# Patient Record
Sex: Male | Born: 1955 | Race: White | Hispanic: No | Smoking: Current every day smoker
Health system: Southern US, Community
[De-identification: ages and names within clinical notes are randomized; demographics above are authoritative.]

## PROBLEM LIST (undated history)

## (undated) DIAGNOSIS — T18128A Food in esophagus causing other injury, initial encounter: Secondary | ICD-10-CM

## (undated) DIAGNOSIS — K219 Gastro-esophageal reflux disease without esophagitis: Secondary | ICD-10-CM

## (undated) DIAGNOSIS — I4891 Unspecified atrial fibrillation: Secondary | ICD-10-CM

## (undated) DIAGNOSIS — K222 Esophageal obstruction: Secondary | ICD-10-CM

## (undated) DIAGNOSIS — W44F3XA Food entering into or through a natural orifice, initial encounter: Secondary | ICD-10-CM

## (undated) HISTORY — DX: Esophageal obstruction: K22.2

## (undated) HISTORY — DX: Food in esophagus causing other injury, initial encounter: T18.128A

## (undated) HISTORY — DX: Food entering into or through a natural orifice, initial encounter: W44.F3XA

## (undated) HISTORY — PX: APPENDECTOMY: SHX54

## (undated) HISTORY — DX: Unspecified atrial fibrillation: I48.91

---

## 2001-08-29 ENCOUNTER — Encounter: Payer: Self-pay | Admitting: Emergency Medicine

## 2001-08-30 ENCOUNTER — Inpatient Hospital Stay (HOSPITAL_COMMUNITY): Admission: EM | Admit: 2001-08-30 | Discharge: 2001-09-03 | Payer: Self-pay | Admitting: Emergency Medicine

## 2003-09-06 ENCOUNTER — Emergency Department (HOSPITAL_COMMUNITY): Admission: EM | Admit: 2003-09-06 | Discharge: 2003-09-06 | Payer: Self-pay | Admitting: Emergency Medicine

## 2011-11-13 ENCOUNTER — Emergency Department (HOSPITAL_COMMUNITY)
Admission: EM | Admit: 2011-11-13 | Discharge: 2011-11-13 | Disposition: A | Discharge status: Home / Self Care | Payer: Non-veteran care | Source: Home / Self Care | Attending: Emergency Medicine | Admitting: Emergency Medicine

## 2011-11-13 ENCOUNTER — Encounter (HOSPITAL_BASED_OUTPATIENT_CLINIC_OR_DEPARTMENT_OTHER): Payer: Self-pay | Admitting: Anesthesiology

## 2011-11-13 ENCOUNTER — Encounter (HOSPITAL_COMMUNITY): Payer: Self-pay | Admitting: Emergency Medicine

## 2011-11-13 ENCOUNTER — Emergency Department (HOSPITAL_COMMUNITY): Payer: Non-veteran care

## 2011-11-13 ENCOUNTER — Ambulatory Visit (HOSPITAL_BASED_OUTPATIENT_CLINIC_OR_DEPARTMENT_OTHER): Payer: Non-veteran care | Admitting: Anesthesiology

## 2011-11-13 ENCOUNTER — Ambulatory Visit (HOSPITAL_BASED_OUTPATIENT_CLINIC_OR_DEPARTMENT_OTHER)
Admission: RE | Admit: 2011-11-13 | Discharge: 2011-11-13 | Disposition: A | Discharge status: Home / Self Care | Payer: Non-veteran care | Source: Ambulatory Visit | Attending: Orthopedic Surgery | Admitting: Orthopedic Surgery

## 2011-11-13 ENCOUNTER — Encounter (HOSPITAL_BASED_OUTPATIENT_CLINIC_OR_DEPARTMENT_OTHER): Payer: Self-pay | Admitting: *Deleted

## 2011-11-13 ENCOUNTER — Encounter (HOSPITAL_BASED_OUTPATIENT_CLINIC_OR_DEPARTMENT_OTHER)
Admission: RE | Disposition: A | Discharge status: Home / Self Care | Payer: Self-pay | Source: Ambulatory Visit | Attending: Orthopedic Surgery

## 2011-11-13 DIAGNOSIS — M79609 Pain in unspecified limb: Secondary | ICD-10-CM | POA: Insufficient documentation

## 2011-11-13 DIAGNOSIS — W64XXXA Exposure to other animate mechanical forces, initial encounter: Secondary | ICD-10-CM | POA: Insufficient documentation

## 2011-11-13 DIAGNOSIS — S62609B Fracture of unspecified phalanx of unspecified finger, initial encounter for open fracture: Secondary | ICD-10-CM | POA: Insufficient documentation

## 2011-11-13 DIAGNOSIS — IMO0002 Reserved for concepts with insufficient information to code with codable children: Secondary | ICD-10-CM | POA: Insufficient documentation

## 2011-11-13 DIAGNOSIS — F17200 Nicotine dependence, unspecified, uncomplicated: Secondary | ICD-10-CM | POA: Insufficient documentation

## 2011-11-13 HISTORY — PX: TENDON REPAIR: SHX5111

## 2011-11-13 SURGERY — TENDON REPAIR
Anesthesia: General | Site: Finger | Laterality: Left | Wound class: Dirty or Infected

## 2011-11-13 MED ORDER — ONDANSETRON HCL 4 MG/2ML IJ SOLN
INTRAMUSCULAR | Status: DC | PRN
  Administered 2011-11-13: 4 mg via INTRAVENOUS

## 2011-11-13 MED ORDER — DOXYCYCLINE HYCLATE 100 MG PO TABS: 100.0000 mg | ORAL_TABLET | Freq: Two times a day (BID) | ORAL | Status: DC

## 2011-11-13 MED ORDER — DEXAMETHASONE SODIUM PHOSPHATE 4 MG/ML IJ SOLN
INTRAMUSCULAR | Status: DC | PRN
  Administered 2011-11-13: 10 mg via INTRAVENOUS

## 2011-11-13 MED ORDER — FENTANYL CITRATE 0.05 MG/ML IJ SOLN
INTRAMUSCULAR | Status: DC | PRN
  Administered 2011-11-13: 100 ug via INTRAVENOUS

## 2011-11-13 MED ORDER — LIDOCAINE HCL (CARDIAC) 20 MG/ML IV SOLN
INTRAVENOUS | Status: DC | PRN
  Administered 2011-11-13: 75 mg via INTRAVENOUS

## 2011-11-13 MED ORDER — MIDAZOLAM HCL 5 MG/5ML IJ SOLN
INTRAMUSCULAR | Status: DC | PRN
  Administered 2011-11-13: 2 mg via INTRAVENOUS

## 2011-11-13 MED ORDER — OXYCODONE-ACETAMINOPHEN 5-325 MG PO TABS: ORAL_TABLET | ORAL | Status: DC

## 2011-11-13 MED ORDER — CEFAZOLIN SODIUM 1-5 GM-% IV SOLN
1.0000 g | Freq: Once | INTRAVENOUS | Status: AC
  Administered 2011-11-13: 1 g via INTRAVENOUS
  Administered 2011-11-13: 2 g via INTRAVENOUS
  Filled 2011-11-13: qty 50

## 2011-11-13 MED ORDER — PROPOFOL 10 MG/ML IV BOLUS
INTRAVENOUS | Status: DC | PRN
  Administered 2011-11-13: 200 mg via INTRAVENOUS

## 2011-11-13 MED ORDER — ONDANSETRON HCL 4 MG/2ML IJ SOLN
4.0000 mg | Freq: Once | INTRAMUSCULAR | Status: AC
  Administered 2011-11-13: 4 mg via INTRAVENOUS
  Filled 2011-11-13: qty 2

## 2011-11-13 MED ORDER — BUPIVACAINE HCL (PF) 0.25 % IJ SOLN
INTRAMUSCULAR | Status: DC | PRN
  Administered 2011-11-13: 10 mL

## 2011-11-13 MED ORDER — BUPIVACAINE HCL (PF) 0.5 % IJ SOLN
10.0000 mL | Freq: Once | INTRAMUSCULAR | Status: AC
  Administered 2011-11-13: 10 mL
  Filled 2011-11-13: qty 30

## 2011-11-13 MED ORDER — MORPHINE SULFATE 4 MG/ML IJ SOLN
4.0000 mg | Freq: Once | INTRAMUSCULAR | Status: AC
  Administered 2011-11-13: 4 mg via INTRAVENOUS
  Filled 2011-11-13: qty 1

## 2011-11-13 MED ORDER — TETANUS-DIPHTH-ACELL PERTUSSIS 5-2.5-18.5 LF-MCG/0.5 IM SUSP
0.5000 mL | Freq: Once | INTRAMUSCULAR | Status: AC
  Administered 2011-11-13: 0.5 mL via INTRAMUSCULAR
  Filled 2011-11-13: qty 0.5

## 2011-11-13 MED ORDER — LACTATED RINGERS IV SOLN
INTRAVENOUS | Status: DC
  Administered 2011-11-13 (×2): via INTRAVENOUS

## 2011-11-13 SURGICAL SUPPLY — 83 items
BAG DECANTER FOR FLEXI CONT (MISCELLANEOUS) IMPLANT
BANDAGE CONFORM 2  STR LF (GAUZE/BANDAGES/DRESSINGS) IMPLANT
BANDAGE ELASTIC 3 VELCRO ST LF (GAUZE/BANDAGES/DRESSINGS) ×2 IMPLANT
BANDAGE GAUZE ELAST BULKY 4 IN (GAUZE/BANDAGES/DRESSINGS) ×2 IMPLANT
BLADE MINI RND TIP GREEN BEAV (BLADE) IMPLANT
BLADE SURG 15 STRL LF DISP TIS (BLADE) ×2 IMPLANT
BLADE SURG 15 STRL SS (BLADE) ×2
BNDG ELASTIC 2 VLCR STRL LF (GAUZE/BANDAGES/DRESSINGS) IMPLANT
BNDG ESMARK 4X9 LF (GAUZE/BANDAGES/DRESSINGS) ×2 IMPLANT
CHLORAPREP W/TINT 26ML (MISCELLANEOUS) IMPLANT
CLOTH BEACON ORANGE TIMEOUT ST (SAFETY) ×2 IMPLANT
CORDS BIPOLAR (ELECTRODE) ×2 IMPLANT
COTTONBALL LRG STERILE PKG (GAUZE/BANDAGES/DRESSINGS) IMPLANT
COVER MAYO STAND STRL (DRAPES) ×2 IMPLANT
COVER TABLE BACK 60X90 (DRAPES) ×2 IMPLANT
CUFF TOURNIQUET SINGLE 18IN (TOURNIQUET CUFF) ×2 IMPLANT
DECANTER SPIKE VIAL GLASS SM (MISCELLANEOUS) IMPLANT
DRAIN TLS ROUND 10FR (DRAIN) IMPLANT
DRAPE EXTREMITY T 121X128X90 (DRAPE) ×2 IMPLANT
DRAPE OEC MINIVIEW 54X84 (DRAPES) ×2 IMPLANT
DRAPE SURG 17X23 STRL (DRAPES) ×2 IMPLANT
DRSG PAD ABDOMINAL 8X10 ST (GAUZE/BANDAGES/DRESSINGS) IMPLANT
GAUZE SPONGE 4X4 16PLY XRAY LF (GAUZE/BANDAGES/DRESSINGS) IMPLANT
GAUZE XEROFORM 1X8 LF (GAUZE/BANDAGES/DRESSINGS) ×2 IMPLANT
GLOVE BIO SURGEON STRL SZ 6.5 (GLOVE) ×4 IMPLANT
GLOVE BIO SURGEON STRL SZ7.5 (GLOVE) ×2 IMPLANT
GLOVE BIOGEL PI IND STRL 8 (GLOVE) ×1 IMPLANT
GLOVE BIOGEL PI INDICATOR 8 (GLOVE) ×1
GLOVE SURG ORTHO 8.0 STRL STRW (GLOVE) IMPLANT
GOWN PREVENTION PLUS XLARGE (GOWN DISPOSABLE) ×2 IMPLANT
GOWN PREVENTION PLUS XXLARGE (GOWN DISPOSABLE) IMPLANT
GOWN STRL REIN XL XLG (GOWN DISPOSABLE) ×2 IMPLANT
KWIRE 4.0 X .035IN (WIRE) ×4 IMPLANT
LOOP VESSEL MAXI BLUE (MISCELLANEOUS) IMPLANT
NEEDLE HYPO 22GX1.5 SAFETY (NEEDLE) IMPLANT
NEEDLE HYPO 25X1 1.5 SAFETY (NEEDLE) ×2 IMPLANT
NEEDLE KEITH (NEEDLE) IMPLANT
NS IRRIG 1000ML POUR BTL (IV SOLUTION) ×2 IMPLANT
PACK BASIN DAY SURGERY FS (CUSTOM PROCEDURE TRAY) ×2 IMPLANT
PAD CAST 3X4 CTTN HI CHSV (CAST SUPPLIES) IMPLANT
PAD CAST 4YDX4 CTTN HI CHSV (CAST SUPPLIES) IMPLANT
PADDING CAST ABS 3INX4YD NS (CAST SUPPLIES) ×1
PADDING CAST ABS 4INX4YD NS (CAST SUPPLIES)
PADDING CAST ABS COTTON 3X4 (CAST SUPPLIES) ×1 IMPLANT
PADDING CAST ABS COTTON 4X4 ST (CAST SUPPLIES) IMPLANT
PADDING CAST COTTON 3X4 STRL (CAST SUPPLIES)
PADDING CAST COTTON 4X4 STRL (CAST SUPPLIES)
SLEEVE SCD COMPRESS KNEE MED (MISCELLANEOUS) IMPLANT
SPLINT PLASTER CAST XFAST 3X15 (CAST SUPPLIES) ×1 IMPLANT
SPLINT PLASTER CAST XFAST 4X15 (CAST SUPPLIES) IMPLANT
SPLINT PLASTER XTRA FAST SET 4 (CAST SUPPLIES)
SPLINT PLASTER XTRA FASTSET 3X (CAST SUPPLIES) ×1
SPONGE GAUZE 4X4 12PLY (GAUZE/BANDAGES/DRESSINGS) ×2 IMPLANT
STOCKINETTE 4X48 STRL (DRAPES) ×2 IMPLANT
SUT CHROMIC 5 0 P 3 (SUTURE) IMPLANT
SUT ETHIBOND 3-0 V-5 (SUTURE) IMPLANT
SUT ETHILON 3 0 PS 1 (SUTURE) IMPLANT
SUT ETHILON 4 0 PS 2 18 (SUTURE) ×2 IMPLANT
SUT FIBERWIRE 3-0 18 TAPR NDL (SUTURE)
SUT FIBERWIRE 4-0 18 DIAM BLUE (SUTURE)
SUT MERSILENE 2.0 SH NDLE (SUTURE) IMPLANT
SUT MERSILENE 3 0 FS 1 (SUTURE) IMPLANT
SUT MERSILENE 4 0 P 3 (SUTURE) IMPLANT
SUT POLY BUTTON 15MM (SUTURE) IMPLANT
SUT PROLENE 2 0 SH DA (SUTURE) IMPLANT
SUT PROLENE 6 0 P 1 18 (SUTURE) IMPLANT
SUT SILK 2 0 FS (SUTURE) IMPLANT
SUT SILK 4 0 PS 2 (SUTURE) IMPLANT
SUT STEEL 4 0 V 26 (SUTURE) IMPLANT
SUT VIC AB 3-0 PS1 18 (SUTURE)
SUT VIC AB 3-0 PS1 18XBRD (SUTURE) IMPLANT
SUT VIC AB 4-0 P-3 18XBRD (SUTURE) IMPLANT
SUT VIC AB 4-0 P3 18 (SUTURE)
SUT VICRYL 4-0 PS2 18IN ABS (SUTURE) IMPLANT
SUTURE FIBERWR 3-0 18 TAPR NDL (SUTURE) IMPLANT
SUTURE FIBERWR 4-0 18 DIA BLUE (SUTURE) IMPLANT
SYR BULB 3OZ (MISCELLANEOUS) ×2 IMPLANT
SYR CONTROL 10ML LL (SYRINGE) ×2 IMPLANT
TOWEL OR 17X24 6PK STRL BLUE (TOWEL DISPOSABLE) ×4 IMPLANT
TRAY DSU PREP LF (CUSTOM PROCEDURE TRAY) ×2 IMPLANT
TUBE FEEDING 5FR 15 INCH (TUBING) IMPLANT
UNDERPAD 30X30 INCONTINENT (UNDERPADS AND DIAPERS) ×2 IMPLANT
WATER STERILE IRR 1000ML POUR (IV SOLUTION) IMPLANT

## 2011-11-13 NOTE — Progress Notes (Signed)
Pt's friend, Rosanne Ashing, was the patients caregiver upon discharge. Rosanne Ashing stated that he will retain the patients pain medication and only allow him to have medication if it became absolutely necessary. Pt caregiver also stated that he did not intent to spend the next 24 hours with the patient, stating that his home would need a "bulldozer" to get inside. When Rosanne Ashing was told that the patient could not be discharged without someone caring for him for the next 24 hours, he said that he would.

## 2011-11-13 NOTE — ED Provider Notes (Signed)
At 1245 PM I interviewed patient who states he was working outside with a horse and he had put him on a walker and was reaching to turn on the switch and the horse kicked him so  his left index finger was pressed against the assembly injuring his finger   Dg Hand Complete Left  11/13/2011  *RADIOLOGY REPORT*  Clinical Data: Kicked by horse, pain, swelling and laceration and index finger  LEFT HAND - COMPLETE 3+ VIEW  Comparison: None  Findings: Osseous mineralization normal. Mild joint space narrowing at the scaphotrapezium joint. Joint spaces otherwise preserved. Comminuted displaced fracture at base of proximal phalanx left index finger. No definite intra-articular extension. No additional fracture, dislocation or bone destruction.  IMPRESSION: Comminuted displaced fracture at base of proximal phalanx left index finger.   Original Report Authenticated By: [name], M.D.    Diagnoses that have been ruled out:  None  Diagnoses that are still under consideration:  None  Final diagnoses:  Open fracture of phalanx of finger    Medical screening examination/treatment/procedure(s) were conducted as a shared visit with non-physician practitioner(s) and myself.  I personally evaluated the patient during the encounter  Devoria Albe, MD, FACEP  I personally performed the services described in this documentation, which was scribed in my presence. The recorded information has been reviewed and considered.    Ward Givens, MD 11/13/11 [phone]

## 2011-11-13 NOTE — Anesthesia Postprocedure Evaluation (Signed)
Anesthesia Post Note  Patient: Kent Davidson  Procedure(s) Performed: Procedure(s) (LRB): TENDON REPAIR (Left)  Anesthesia type: general  Patient location: PACU  Post pain: Pain level controlled  Post assessment: Patient's Cardiovascular Status Stable  Last Vitals:  Filed Vitals:   11/13/11 1845  BP: 131/82  Pulse: 41  Temp:   Resp: 9    Post vital signs: Reviewed and stable  Level of consciousness: sedated  Complications: No apparent anesthesia complications

## 2011-11-13 NOTE — Anesthesia Procedure Notes (Signed)
Procedure Name: LMA Insertion Date/Time: 11/13/2011 5:19 PM Performed by: Zenia Resides D Pre-anesthesia Checklist: Patient identified, Emergency Drugs available, Suction available, Patient being monitored and Timeout performed Patient Re-evaluated:Patient Re-evaluated prior to inductionOxygen Delivery Method: Circle System Utilized Preoxygenation: Pre-oxygenation with 100% oxygen Intubation Type: IV induction Ventilation: Mask ventilation without difficulty LMA: LMA inserted LMA Size: 5.0 Number of attempts: 1 Airway Equipment and Method: bite block Placement Confirmation: positive ETCO2 and breath sounds checked- equal and bilateral Tube secured with: Tape Dental Injury: Teeth and Oropharynx as per pre-operative assessment

## 2011-11-13 NOTE — ED Provider Notes (Signed)
Medical screening examination/treatment/procedure(s) were performed by non-physician practitioner and as supervising physician I was immediately available for consultation/collaboration.  Devoria Albe, MD, Armando Gang   Ward Givens, MD 11/13/11 [phone]

## 2011-11-13 NOTE — Op Note (Signed)
Dictation [phone]

## 2011-11-13 NOTE — Anesthesia Preprocedure Evaluation (Signed)
Anesthesia Evaluation  Patient identified by MRN, date of birth, ID band Patient awake    Reviewed: Allergy & Precautions, H&P , NPO status , Patient's Chart, lab work & pertinent test results  Airway Mallampati: II  Neck ROM: full    Dental   Pulmonary Current Smoker,          Cardiovascular     Neuro/Psych    GI/Hepatic   Endo/Other    Renal/GU      Musculoskeletal   Abdominal   Peds  Hematology   Anesthesia Other Findings   Reproductive/Obstetrics                           Anesthesia Physical Anesthesia Plan  ASA: II  Anesthesia Plan: General   Post-op Pain Management:    Induction: Intravenous  Airway Management Planned: LMA  Additional Equipment:   Intra-op Plan:   Post-operative Plan:   Informed Consent: I have reviewed the patients History and Physical, chart, labs and discussed the procedure including the risks, benefits and alternatives for the proposed anesthesia with the patient or authorized representative who has indicated his/her understanding and acceptance.     Plan Discussed with: CRNA and Surgeon  Anesthesia Plan Comments:         Anesthesia Quick Evaluation  

## 2011-11-13 NOTE — ED Notes (Signed)
Pt had a horse to kick him and caught his left hand, large lac to left index finger, c/o throbbing to finger

## 2011-11-13 NOTE — ED Provider Notes (Signed)
History     CSN: [id number]  Arrival date & time 11/13/11  1041   First MD Initiated Contact with Patient 11/13/11 1146      Chief Complaint  Patient presents with  . Laceration    (Consider location/radiation/quality/duration/timing/severity/associated sxs/prior treatment) HPI Comments: Kent Davidson presents with laceration to the left dorsal index finger after being kicked by a worse prior to arrival.  He has normal distal sensation in his finger but is unable to move the finger secondary to pain and weakness.  He does have deformity of the finger, the wound is hemostatic, as patient has applied pressure prior to arrival.  He has no other significant past medical history.  He believes his last tetanus shot was 9 years ago.  He denies any other injury from today's incident.  The history is provided by the patient.    History reviewed. No pertinent past medical history.  Past Surgical History  Procedure Date  . Appendectomy     No family history on file.  History  Substance Use Topics  . Smoking status: Current Every Day Smoker -- 0.5 packs/day    Types: Cigarettes  . Smokeless tobacco: Not on file  . Alcohol Use: No      Review of Systems  Constitutional: Negative for fever.  HENT: Negative for congestion, sore throat and neck pain.   Eyes: Negative.   Respiratory: Negative for chest tightness and shortness of breath.   Cardiovascular: Negative for chest pain.  Gastrointestinal: Negative for nausea and abdominal pain.  Genitourinary: Negative.   Musculoskeletal: Positive for arthralgias. Negative for joint swelling.  Skin: Positive for wound. Negative for rash.  Neurological: Negative for dizziness, weakness, light-headedness, numbness and headaches.  Hematological: Negative.   Psychiatric/Behavioral: Negative.     Allergies  Bee venom  Home Medications  No current outpatient prescriptions on file.  BP 131/81  Pulse 59  Temp 98.1 F (36.7 C)   Resp 18  Ht 5\' 11"  (1.803 m)  Wt 180 lb (81.647 kg)  BMI 25.10 kg/m2  SpO2 98%  Physical Exam  Constitutional: He is oriented to person, place, and time. He appears well-developed and well-nourished.  HENT:  Head: Normocephalic.  Cardiovascular: Normal rate.   Pulmonary/Chest: Effort normal.  Musculoskeletal: He exhibits tenderness.       Hands:      Deep laceration to his left proximal dorsal index finger with visualized tendon which I suspect is lacerated.  Patient is unable to extend the finger at his [facility] joint, and unable to flex at the PIP.  He has intact distal sensation and less than 3 second cap refill.  Neurological: He is alert and oriented to person, place, and time. No sensory deficit.  Skin: Laceration noted.    ED Course  Procedures (including critical care time)  Labs Reviewed - No data to display Dg Hand Complete Left  11/13/2011  *RADIOLOGY REPORT*  Clinical Data: Kicked by horse, pain, swelling and laceration and index finger  LEFT HAND - COMPLETE 3+ VIEW  Comparison: None  Findings: Osseous mineralization normal. Mild joint space narrowing at the scaphotrapezium joint. Joint spaces otherwise preserved. Comminuted displaced fracture at base of proximal phalanx left index finger. No definite intra-articular extension. No additional fracture, dislocation or bone destruction.  IMPRESSION: Comminuted displaced fracture at base of proximal phalanx left index finger.   Original Report Authenticated By: [name], M.D.      1. Open fracture of phalanx of finger    Laceration  was evaluated after digital block performed with Marcaine 0.5%,  2 cc with adequate anesthetic result.   MDM  Spoke with Dr Hilda Lias who defers pt to hand specialist.  Call placed to Dr. Merlyn Lot in [place] who accepts patient.  Requests patient be n.p.o., Ancef 1 g IV, will meet the patient at home this day surgery Center for surgical repair of his injury.  Discussed plan with patient who agrees  with this plan.  X-rays reviewed with patient.  Tetanus updated.          Burgess Amor, [place] 11/13/11 1334

## 2011-11-13 NOTE — ED Notes (Signed)
Patient transported to X-ray 

## 2011-11-13 NOTE — H&P (Signed)
  Kent Davidson is an 56 y.o. male.   Chief Complaint: left index finger fracture HPI: 56 yo rhd male states he was kicked by a horse this morning.  Seen at [facility] ED where xr revealed a left index finger proximal phalanx fracture.  He also has a laceration on the dorsum of the finger.  Reports no previous injury to the finger and no other injuries at this time.  History reviewed. No pertinent past medical history.  Past Surgical History  Procedure Date  . Appendectomy     History reviewed. No pertinent family history. Social History:  reports that he has been smoking Cigarettes.  He has been smoking about .5 packs per day. He does not have any smokeless tobacco history on file. He reports that he does not drink alcohol or use illicit drugs.  Allergies:  Allergies  Allergen Reactions  . Bee Venom Anaphylaxis    No prescriptions prior to admission    No results found for this or any previous visit (from the past 48 hour(s)).  Dg Hand Complete Left  11/13/2011  *RADIOLOGY REPORT*  Clinical Data: Kicked by horse, pain, swelling and laceration and index finger  LEFT HAND - COMPLETE 3+ VIEW  Comparison: None  Findings: Osseous mineralization normal. Mild joint space narrowing at the scaphotrapezium joint. Joint spaces otherwise preserved. Comminuted displaced fracture at base of proximal phalanx left index finger. No definite intra-articular extension. No additional fracture, dislocation or bone destruction.  IMPRESSION: Comminuted displaced fracture at base of proximal phalanx left index finger.   Original Report Authenticated By: [name], M.D.      A comprehensive review of systems was negative.  Blood pressure 122/81, pulse 59, temperature 97.7 F (36.5 C), temperature source Oral, resp. rate 18, height 5\' 11"  (1.803 m), weight 78.926 kg (174 lb), SpO2 97.00%.  General appearance: alert, cooperative and appears stated age Head: Normocephalic, without obvious abnormality,  atraumatic Neck: supple, symmetrical, trachea midline Resp: clear to auscultation bilaterally Cardio: regular rate and rhythm GI: non tender Extremities: intact sensation and capillary refill all digits except left index.  +epl/fpl/io.  left index with brisk capillary refill and decreased sensation due to digital block at [facility].  states he could feel fingertip before block.  wound on dorsum of proximal phalanx.  no bone exposed.  tendon exposed.  +fdp/fds to index.   Pulses: 2+ and symmetric Skin: as above Neurologic: Grossly normal Incision/Wound: As above  Assessment/Plan Left index finger fracture and laceration.  Do not think this is an open fracture.  Recommend OR for I&D of wound, possible repair extensor tendon, pinning of fracture.  Risks, benefits, and alternatives of surgery were discussed and the patient agrees with the plan of care.  Ancef given and tetanus updated at [facility].   Kent Davidson 11/13/2011, 4:35 PM

## 2011-11-13 NOTE — ED Notes (Signed)
Pt was kicked in hand by horse this am. Laceration to left index finger.

## 2011-11-13 NOTE — Transfer of Care (Signed)
Immediate Anesthesia Transfer of Care Note  Patient: Kent Davidson  Procedure(s) Performed: Procedure(s) (LRB) with comments: TENDON REPAIR (Left) - incision and drainage with percutaneous pinning left index finger   Patient Location: PACU  Anesthesia Type: General  Level of Consciousness: sedated  Airway & Oxygen Therapy: Patient Spontanous Breathing and Patient connected to face mask oxygen  Post-op Assessment: Report given to PACU RN and Post -op Vital signs reviewed and stable  Post vital signs: Reviewed and stable  Complications: No apparent anesthesia complications

## 2011-11-14 ENCOUNTER — Encounter (HOSPITAL_BASED_OUTPATIENT_CLINIC_OR_DEPARTMENT_OTHER): Payer: Self-pay | Admitting: Orthopedic Surgery

## 2011-11-14 NOTE — Op Note (Signed)
NAME:  Kent Davidson, BURGEN NO.:  [id number]  MEDICAL RECORD NO.:  [id number]  LOCATION:                               FACILITY:  [facility]  PHYSICIAN:  Betha Loa, MD        DATE OF BIRTH:  09-01-1955  DATE OF PROCEDURE:  11/13/2011 DATE OF DISCHARGE:  11/13/2011                              OPERATIVE REPORT   PREOPERATIVE DIAGNOSIS:  Left index finger laceration and fracture.  POSTOPERATIVE DIAGNOSIS:  Left index finger laceration with communication with fracture, longitudinal laceration to extensor tendon.  PROCEDURES:  Irrigation and debridement of lacerations and fracture, percutaneous pinning of proximal phalanx of left index finger fracture.  SURGEON:  Betha Loa, MD  ASSISTANT:  None.  ANESTHESIA:  General.  IV FLUIDS:  Per anesthesia flow sheet.  ESTIMATED BLOOD LOSS:  Minimal.  COMPLICATIONS:  None.  SPECIMENS:  None.  TOURNIQUET TIME:  29 minutes.  DISPOSITION:  Stable to PACU.  INDICATIONS:  Kent Davidson is a 56 year old right-hand dominant male who states that this morning while working with his horse on the walker, the horse kicked him in his left index finger.  He had a laceration and pain in the finger.  He presented to the [facility] Emergency Department where radiographs were taken revealing a fracture of the proximal phalanx.  He was referred to me for further care.  On evaluation, he had decreased sensation in the fingertip with brisk capillary refill.  He states he has had a digital block in the finger.  He has two lacerations on the dorsum of the finger, one over the middle phalanx and one over the proximal phalanx.  There was exposed extensor tendon.  I recommended to Mr. Pascarella going to the operating room for irrigation and debridement of the wounds and percutaneous pinning of the fracture. Risks, benefits and alternatives of surgery were discussed including the risk of blood loss; infection; damage to nerves, vessels,  tendons, ligaments, bone; failure of surgery; need for additional surgery; complications with wound healing; continued pain; nonunion; malunion; stiffness.  He voiced understanding of these risks and elected to proceed.  OPERATIVE COURSE:  After being identified preoperatively by myself, the patient and I agreed upon the procedure and site of procedure.  Surgical site was marked.  The risks, benefits, and alternatives of the surgery were reviewed and he wished to proceed.  Surgical consent had been signed.  He had been given IV Ancef and tetanus updated at [facility]. His Ancef was redosed.  He was transferred to the operating room and placed on the operating room table in supine position with left upper extremity on an armboard.  General anesthesia was induced by the anesthesiologist.  The left upper extremity was prepped and draped in normal sterile orthopedic fashion.  A surgical pause was performed between the surgeons, anesthesia, and operating room staff and all were in agreement as to the patient, procedure, and site of procedure. Tourniquet at the proximal aspect of the extremity was inflated to 250 mmHg after exsanguination of the limb with an Esmarch bandage.  The wound was explored.  There was a longitudinal laceration in the tendon on the radial side.  This did communicate with the  fracture site although the fracture site was more proximal.  There was no gross contamination. The wound over the middle phalanx was into the subcutaneous tissues only.  Both wounds were copiously irrigated with sterile saline.  The fracture site was copiously irrigated with sterile saline as well.  I think this is most likely a laceration on top of the fracture rather than a true open fracture.  Two 0.035-inch K-wires were advanced from the proximal aspect of the proximal phalanx across the fracture site into the distal aspect of the proximal phalanx.  This was done from both the radial and ulnar  sides in a crossed fashion.  This provided good stability of the fracture.  C-arm was used in [facility] and lateral projections to ensure appropriate reduction and position of hardware, which was the case.  The finger had good cascade with the other digits and did not appear to be rotated.  The wounds were repaired with 4-0 nylon on a horizontal mattress and interrupted fashion.  The pins were bent and cut short.  All wounds were dressed with sterile Xeroform, 4x4s, and wrapped with a Kerlix bandage.  A volar and dorsal slab splint including the index, long and ring fingers was placed with the MPs flexed and the IPs extended.  This was wrapped with Kerlix and Ace bandage.  Tourniquet was deflated at 29 minutes.  The fingertips were pink with brisk capillary refill after deflation of the tourniquet.  Operative drapes were broken down and the patient was awakened from anesthesia safely.  He was transferred back to the stretcher and taken to PACU in stable condition. I will see him back in the office in 1 week for postoperative followup. I will give him Percocet 5/325 1-2 p.o. q.6 hours p.r.n. pain, dispensed #40 and doxycycline 100 mg p.o. b.i.d. x10 days.     Betha Loa, MD     KK/MEDQ  D:  11/13/2011  T:  11/14/2011  Job:  [id number]

## 2011-11-19 ENCOUNTER — Encounter (HOSPITAL_BASED_OUTPATIENT_CLINIC_OR_DEPARTMENT_OTHER): Payer: Self-pay

## 2017-07-27 ENCOUNTER — Other Ambulatory Visit: Payer: Self-pay

## 2017-07-27 ENCOUNTER — Emergency Department (HOSPITAL_COMMUNITY)
Admission: EM | Admit: 2017-07-27 | Discharge: 2017-07-27 | Disposition: A | Discharge status: Home / Self Care | Payer: Non-veteran care | Attending: Emergency Medicine | Admitting: Emergency Medicine

## 2017-07-27 ENCOUNTER — Emergency Department (HOSPITAL_COMMUNITY): Payer: Non-veteran care

## 2017-07-27 ENCOUNTER — Encounter (HOSPITAL_COMMUNITY): Payer: Self-pay | Admitting: Emergency Medicine

## 2017-07-27 DIAGNOSIS — R1110 Vomiting, unspecified: Secondary | ICD-10-CM | POA: Diagnosis present

## 2017-07-27 DIAGNOSIS — F17210 Nicotine dependence, cigarettes, uncomplicated: Secondary | ICD-10-CM | POA: Diagnosis not present

## 2017-07-27 DIAGNOSIS — R1310 Dysphagia, unspecified: Secondary | ICD-10-CM | POA: Insufficient documentation

## 2017-07-27 LAB — CBC WITH DIFFERENTIAL/PLATELET
BASOS ABS: 0 10*3/uL (ref 0.0–0.1)
BASOS PCT: 0 %
EOS ABS: 0.1 10*3/uL (ref 0.0–0.7)
Eosinophils Relative: 1 %
HCT: 48.4 % (ref 39.0–52.0)
HEMOGLOBIN: 16.4 g/dL (ref 13.0–17.0)
Lymphocytes Relative: 19 %
Lymphs Abs: 1.5 10*3/uL (ref 0.7–4.0)
MCH: 31.8 pg (ref 26.0–34.0)
MCHC: 33.9 g/dL (ref 30.0–36.0)
MCV: 93.8 fL (ref 78.0–100.0)
Monocytes Absolute: 0.7 10*3/uL (ref 0.1–1.0)
Monocytes Relative: 9 %
Neutro Abs: 5.5 10*3/uL (ref 1.7–7.7)
Neutrophils Relative %: 71 %
Platelets: 293 10*3/uL (ref 150–400)
RBC: 5.16 MIL/uL (ref 4.22–5.81)
RDW: 13.9 % (ref 11.5–15.5)
WBC: 7.9 10*3/uL (ref 4.0–10.5)

## 2017-07-27 LAB — URINALYSIS, ROUTINE W REFLEX MICROSCOPIC
BILIRUBIN URINE: NEGATIVE
Glucose, UA: NEGATIVE mg/dL
HGB URINE DIPSTICK: NEGATIVE
Ketones, ur: 5 mg/dL — AB
Leukocytes, UA: NEGATIVE
Nitrite: NEGATIVE
PROTEIN: 30 mg/dL — AB
Specific Gravity, Urine: 1.039 — ABNORMAL HIGH (ref 1.005–1.030)
pH: 5 (ref 5.0–8.0)

## 2017-07-27 LAB — COMPREHENSIVE METABOLIC PANEL
ALBUMIN: 4.2 g/dL (ref 3.5–5.0)
ALK PHOS: 65 U/L (ref 38–126)
ALT: 22 U/L (ref 17–63)
AST: 26 U/L (ref 15–41)
Anion gap: 9 (ref 5–15)
BUN: 20 mg/dL (ref 6–20)
CALCIUM: 9.1 mg/dL (ref 8.9–10.3)
CO2: 26 mmol/L (ref 22–32)
Chloride: 107 mmol/L (ref 101–111)
Creatinine, Ser: 1.23 mg/dL (ref 0.61–1.24)
GFR calc Af Amer: >60 mL/min (ref >60)
GFR calc non Af Amer: >60 mL/min (ref >60)
GLUCOSE: 98 mg/dL (ref 65–99)
Potassium: 4.1 mmol/L (ref 3.5–5.1)
SODIUM: 142 mmol/L (ref 135–145)
Total Bilirubin: 0.7 mg/dL (ref 0.3–1.2)
Total Protein: 7.6 g/dL (ref 6.5–8.1)

## 2017-07-27 LAB — LIPASE, BLOOD: Lipase: 37 U/L (ref 11–51)

## 2017-07-27 MED ORDER — SODIUM CHLORIDE 0.9 % IV BOLUS
1000.0000 mL | Freq: Once | INTRAVENOUS | Status: AC
  Administered 2017-07-27: 1000 mL via INTRAVENOUS

## 2017-07-27 MED ORDER — SODIUM CHLORIDE 0.9 % IV SOLN
INTRAVENOUS | Status: DC
  Administered 2017-07-27: 15:00:00 via INTRAVENOUS

## 2017-07-27 NOTE — Discharge Instructions (Addendum)
Follow-up with the [place] in [place] as scheduled for this week for further evaluation of the problem with swallowing in the neck area.  Today received IV fluids your labs were normal.  Chest x-ray was normal.  Work hard to get small amounts of fluid and you with sugar or Ensure.  The IV fluids he received today will hold your for 24 hours or little bit longer.  No signs of significant dehydration based on labs today.

## 2017-07-27 NOTE — ED Triage Notes (Signed)
Pt has been having n/v since Wednesday.  States as soon as he swallows it comes back up.  Denies abd pain or diarrhea.

## 2017-07-27 NOTE — ED Provider Notes (Addendum)
[facility] EMERGENCY DEPARTMENT Provider Note   CSN: [id number] Arrival date & time: 07/27/17  1120     History   Chief Complaint Chief Complaint  Patient presents with  . Emesis    HPI Kent Davidson is a 62 y.o. male.  Patient with some difficulty swallowing now for several days to weeks.  Was seen this week in the [place] at [facility] they did CT scan of the neck he is to be back there on Tuesday for an upper endoscopy.  They are trying to evaluate wise having difficulty swallowing.  Patient able to swallow his saliva.  Patient without any other neurofocal deficits.  Patient denies any chest pain shortness of breath or abdominal pain no true nausea or vomiting.  No fevers.  No pain in the throat.  Patient is here mostly today because he feels he is having difficulty swallowing liquids and food and is concerned he is dehydrated.  Again patient is able to swallow his saliva.     History reviewed. No pertinent past medical history.  There are no active problems to display for this patient.   Past Surgical History:  Procedure Laterality Date  . APPENDECTOMY    . TENDON REPAIR  11/13/2011   Procedure: TENDON REPAIR;  Surgeon: Tennis Must, MD;  Location: [place];  Service: Orthopedics;  Laterality: Left;  incision and drainage with percutaneous pinning left index finger         Home Medications    Prior to Admission medications   Not on File    Family History History reviewed. No pertinent family history.  Social History Social History   Tobacco Use  . Smoking status: Current Every Day Smoker    Packs/day: 0.50    Types: Cigarettes  Substance Use Topics  . Alcohol use: No  . Drug use: No     Allergies   Bee venom   Review of Systems Review of Systems  Constitutional: Negative for fever.  HENT: Positive for trouble swallowing. Negative for congestion and voice change.   Eyes: Negative for visual disturbance.  Respiratory: Negative  for shortness of breath.   Cardiovascular: Negative for chest pain.  Gastrointestinal: Negative for abdominal pain, nausea and vomiting.  Genitourinary: Negative for dysuria.  Musculoskeletal: Negative for back pain.  Skin: Negative for rash.  Neurological: Negative for headaches.  Hematological: Does not bruise/bleed easily.  Psychiatric/Behavioral: Negative for confusion.     Physical Exam Updated Vital Signs BP (!) 125/92 (BP Location: Right Arm)   Pulse (!) 59   Temp (!) 97.1 F (36.2 C) (Oral)   Resp 19   Ht 1.803 m (5\' 11" )   Wt 72.6 kg (160 lb)   SpO2 97%   BMI 22.32 kg/m   Physical Exam  Constitutional: He is oriented to person, place, and time. He appears well-developed and well-nourished. No distress.  HENT:  Head: Normocephalic and atraumatic.  Mouth/Throat: Oropharynx is clear and moist. No oropharyngeal exudate.  Oropharynx appears normal.  Eyes: Pupils are equal, round, and reactive to light. Conjunctivae and EOM are normal.  Neck: Normal range of motion. Neck supple.  Cardiovascular: Normal rate, regular rhythm and normal heart sounds.  Pulmonary/Chest: Effort normal and breath sounds normal. No respiratory distress.  Abdominal: Soft. Bowel sounds are normal. There is no tenderness.  Musculoskeletal: Normal range of motion.  Neurological: He is alert and oriented to person, place, and time. No cranial nerve deficit or sensory deficit. He exhibits normal muscle tone.  Coordination normal.  Skin: Skin is warm.  Nursing note and vitals reviewed.    ED Treatments / Results  Labs (all labs ordered are listed, but only abnormal results are displayed) Labs Reviewed  CBC WITH DIFFERENTIAL/PLATELET  COMPREHENSIVE METABOLIC PANEL  LIPASE, BLOOD  URINALYSIS, ROUTINE W REFLEX MICROSCOPIC    EKG None  Radiology Dg Chest 2 View  Result Date: 07/27/2017 CLINICAL DATA:  Cough, difficulty swallowing, and vomiting. EXAM: CHEST - 2 VIEW COMPARISON:  None.  FINDINGS: The cardiac silhouette is normal in size. The thoracic aorta is tortuous. A small sliding hiatal hernia is suspected. The lungs are hyperinflated with attenuation of the vascular markings in the right greater than left apices suggesting emphysema. 8 mm nodular density projecting over the right lower lung on the PA radiograph likely represents a nipple shadow, with a similar though less discrete density present at the same level on the contralateral side. No airspace consolidation, edema, pleural effusion, or pneumothorax is identified. Multiple small retained metallic fragments are noted in the left axillary region. Thoracic spondylosis is noted. IMPRESSION: COPD without evidence of acute abnormality. Electronically Signed   By: Logan Bores M.D.   On: 07/27/2017 14:23    Procedures Procedures (including critical care time)  Medications Ordered in ED Medications  0.9 %  sodium chloride infusion ( Intravenous New Bag/Given 07/27/17 1516)  sodium chloride 0.9 % bolus 1,000 mL (0 mLs Intravenous Stopped 07/27/17 1502)     Initial Impression / Assessment and Plan / ED Course  I have reviewed the triage vital signs and the nursing notes.  Pertinent labs & imaging results that were available during my care of the patient were reviewed by me and considered in my medical decision making (see chart for details).     Patient's work-up here lab wise without any acute findings.  To include liver function test.  Also chest x-ray negative.  Based on what he saying is the [facility] is doing evaluation to determine why he is having some dysphasia trouble swallowing.  Clearly is not completely obstructed because his saliva is gone down.  Apparently few days ago they did CT of the neck without any significant findings and they are planning on an endoscopy of the area for Tuesday of this week.  Patient stable for discharge and further work-up at the [place].  Final Clinical Impressions(s) / ED Diagnoses    Final diagnoses:  Dysphagia, unspecified type    ED Discharge Orders    None       Fredia Sorrow, MD 07/27/17 1537  Patient's EKG shows a sinus bradycardia heart rates in the 40s.  Looking at old EKG from 2003 patient's heart rate was the same.  Patient is asymptomatic when he standing and walking.    Fredia Sorrow, MD 07/27/17 [phone]

## 2017-07-27 NOTE — ED Notes (Signed)
Pt states he does not need to urinate at this time, given urinal, aware of DO.

## 2017-07-27 NOTE — ED Notes (Signed)
Pt's HR 42-50.  Pt asymptomatic.  EDP aware, ekg ordered.

## 2018-10-02 ENCOUNTER — Encounter (HOSPITAL_COMMUNITY): Payer: Self-pay

## 2018-10-02 ENCOUNTER — Emergency Department (HOSPITAL_COMMUNITY)
Admission: EM | Admit: 2018-10-02 | Discharge: 2018-10-03 | Disposition: A | Discharge status: Home / Self Care | Payer: No Typology Code available for payment source | Attending: Orthopedic Surgery | Admitting: Orthopedic Surgery

## 2018-10-02 ENCOUNTER — Other Ambulatory Visit: Payer: Self-pay

## 2018-10-02 ENCOUNTER — Emergency Department (HOSPITAL_COMMUNITY): Payer: No Typology Code available for payment source

## 2018-10-02 DIAGNOSIS — W5512XA Struck by horse, initial encounter: Secondary | ICD-10-CM | POA: Diagnosis not present

## 2018-10-02 DIAGNOSIS — S5291XB Unspecified fracture of right forearm, initial encounter for open fracture type I or II: Secondary | ICD-10-CM

## 2018-10-02 DIAGNOSIS — F17210 Nicotine dependence, cigarettes, uncomplicated: Secondary | ICD-10-CM | POA: Diagnosis not present

## 2018-10-02 DIAGNOSIS — Z20828 Contact with and (suspected) exposure to other viral communicable diseases: Secondary | ICD-10-CM | POA: Diagnosis not present

## 2018-10-02 DIAGNOSIS — K219 Gastro-esophageal reflux disease without esophagitis: Secondary | ICD-10-CM | POA: Diagnosis not present

## 2018-10-02 DIAGNOSIS — T148XXA Other injury of unspecified body region, initial encounter: Secondary | ICD-10-CM

## 2018-10-02 DIAGNOSIS — S52231A Displaced oblique fracture of shaft of right ulna, initial encounter for closed fracture: Secondary | ICD-10-CM | POA: Diagnosis not present

## 2018-10-02 DIAGNOSIS — Z01818 Encounter for other preprocedural examination: Secondary | ICD-10-CM

## 2018-10-02 HISTORY — DX: Gastro-esophageal reflux disease without esophagitis: K21.9

## 2018-10-02 LAB — COMPREHENSIVE METABOLIC PANEL
ALT: 25 U/L (ref 0–44)
AST: 28 U/L (ref 15–41)
Albumin: 3.9 g/dL (ref 3.5–5.0)
Alkaline Phosphatase: 56 U/L (ref 38–126)
Anion gap: 8 (ref 5–15)
BUN: 21 mg/dL (ref 8–23)
CO2: 25 mmol/L (ref 22–32)
Calcium: 9.4 mg/dL (ref 8.9–10.3)
Chloride: 109 mmol/L (ref 98–111)
Creatinine, Ser: 0.96 mg/dL (ref 0.61–1.24)
GFR calc Af Amer: >60 mL/min (ref >60)
GFR calc non Af Amer: >60 mL/min (ref >60)
Glucose, Bld: 115 mg/dL — ABNORMAL HIGH (ref 70–99)
Potassium: 4.4 mmol/L (ref 3.5–5.1)
Sodium: 142 mmol/L (ref 135–145)
Total Bilirubin: 0.6 mg/dL (ref 0.3–1.2)
Total Protein: 6.6 g/dL (ref 6.5–8.1)

## 2018-10-02 LAB — CBC WITH DIFFERENTIAL/PLATELET
Abs Immature Granulocytes: 0.03 10*3/uL (ref 0.00–0.07)
Basophils Absolute: 0 10*3/uL (ref 0.0–0.1)
Basophils Relative: 0 %
Eosinophils Absolute: 0.1 10*3/uL (ref 0.0–0.5)
Eosinophils Relative: 1 %
HCT: 42.9 % (ref 39.0–52.0)
Hemoglobin: 14 g/dL (ref 13.0–17.0)
Immature Granulocytes: 0 %
Lymphocytes Relative: 14 %
Lymphs Abs: 1.4 10*3/uL (ref 0.7–4.0)
MCH: 31.7 pg (ref 26.0–34.0)
MCHC: 32.6 g/dL (ref 30.0–36.0)
MCV: 97.1 fL (ref 80.0–100.0)
Monocytes Absolute: 0.6 10*3/uL (ref 0.1–1.0)
Monocytes Relative: 6 %
Neutro Abs: 7.9 10*3/uL — ABNORMAL HIGH (ref 1.7–7.7)
Neutrophils Relative %: 79 %
Platelets: 162 10*3/uL (ref 150–400)
RBC: 4.42 MIL/uL (ref 4.22–5.81)
RDW: 13.8 % (ref 11.5–15.5)
WBC: 10.2 10*3/uL (ref 4.0–10.5)
nRBC: 0 % (ref 0.0–0.2)

## 2018-10-02 LAB — SARS CORONAVIRUS 2 BY RT PCR (HOSPITAL ORDER, PERFORMED IN ~~LOC~~ HOSPITAL LAB): SARS Coronavirus 2: NEGATIVE

## 2018-10-02 MED ORDER — HYDROCODONE-ACETAMINOPHEN 5-325 MG PO TABS: 1.0000 | ORAL_TABLET | ORAL | Status: DC | PRN

## 2018-10-02 MED ORDER — HYDROMORPHONE HCL 1 MG/ML IJ SOLN: 0.5000 mg | INTRAMUSCULAR | Status: DC | PRN

## 2018-10-02 MED ORDER — CEFAZOLIN SODIUM-DEXTROSE 2-4 GM/100ML-% IV SOLN
2.0000 g | Freq: Three times a day (TID) | INTRAVENOUS | Status: DC
  Administered 2018-10-03 (×2): 2 g via INTRAVENOUS
  Filled 2018-10-02: qty 100

## 2018-10-02 MED ORDER — BACITRACIN ZINC 500 UNIT/GM EX OINT
1.0000 "application " | TOPICAL_OINTMENT | Freq: Two times a day (BID) | CUTANEOUS | Status: DC
  Administered 2018-10-02: 1 via TOPICAL
  Filled 2018-10-02: qty 1.8

## 2018-10-02 MED ORDER — PENICILLIN G POTASSIUM 5000000 UNITS IJ SOLR: 2.5000 10*6.[IU] | Freq: Four times a day (QID) | INTRAVENOUS | Status: DC

## 2018-10-02 MED ORDER — SODIUM CHLORIDE 0.9 % IV SOLN
5.0000 10*6.[IU] | Freq: Four times a day (QID) | INTRAVENOUS | Status: DC
  Administered 2018-10-03 (×2): 5 10*6.[IU] via INTRAVENOUS
  Filled 2018-10-02 (×15): qty 5

## 2018-10-02 MED ORDER — OXYCODONE-ACETAMINOPHEN 5-325 MG PO TABS
1.0000 | ORAL_TABLET | ORAL | Status: DC | PRN
  Administered 2018-10-03: 03:00:00 1 via ORAL
  Filled 2018-10-02: qty 1

## 2018-10-02 MED ORDER — CEFAZOLIN SODIUM-DEXTROSE 2-4 GM/100ML-% IV SOLN
2.0000 g | Freq: Once | INTRAVENOUS | Status: AC
  Administered 2018-10-02: 21:00:00 2 g via INTRAVENOUS
  Filled 2018-10-02: qty 100

## 2018-10-02 MED ORDER — HYDROCODONE-ACETAMINOPHEN 5-325 MG PO TABS
2.0000 | ORAL_TABLET | Freq: Once | ORAL | Status: AC
  Administered 2018-10-02: 2 via ORAL
  Filled 2018-10-02: qty 2

## 2018-10-02 MED ORDER — SODIUM CHLORIDE 0.9 % IV SOLN
INTRAVENOUS | Status: DC
  Administered 2018-10-02: 22:00:00 via INTRAVENOUS

## 2018-10-02 MED ORDER — ONDANSETRON HCL 4 MG/2ML IJ SOLN: 4.0000 mg | Freq: Four times a day (QID) | INTRAMUSCULAR | Status: DC | PRN

## 2018-10-02 NOTE — ED Provider Notes (Signed)
[facility] EMERGENCY DEPARTMENT Provider Note   CSN: [id number] Arrival date & time: 10/02/18  1946     History   Chief Complaint Chief Complaint  Patient presents with   Arm Injury    HPI Kent Davidson is a 63 y.o. male.     HPI  The patient is a 63 year old male, he has no significant past medical history other than acid reflux.  He reports that just prior to arrival he was kicked in the right arm by a horse.  He developed acute onset of pain in the mid forearm over the ulnar surface, there is pain associated with this but no problems with his hand, wrist, elbow or shoulder on the right upper extremity.  He did fall to the ground and had a small abrasion to his left elbow but no other injuries.  He was not injured in the chest abdomen or pelvis, no pain to the head neck or back.  He was able to get to the hospital without any difficulties.  He is up-to-date on tetanus within the last 4 to 5 years  Past Medical History:  Diagnosis Date   Gastroesophageal reflux     There are no active problems to display for this patient.   Past Surgical History:  Procedure Laterality Date   APPENDECTOMY     TENDON REPAIR  11/13/2011   Procedure: TENDON REPAIR;  Surgeon: Tennis Must, MD;  Location: [place];  Service: Orthopedics;  Laterality: Left;  incision and drainage with percutaneous pinning left index finger         Home Medications    Prior to Admission medications   Not on File    Family History No family history on file.  Social History Social History   Tobacco Use   Smoking status: Current Every Day Smoker    Packs/day: 0.50    Types: Cigarettes  Substance Use Topics   Alcohol use: No   Drug use: No     Allergies   Bee venom   Review of Systems Review of Systems  Skin: Positive for wound.  Neurological: Negative for weakness and numbness.  All other systems reviewed and are negative.    Physical Exam Updated  Vital Signs BP (!) 148/110 (BP Location: Left Arm)    Pulse 60    Temp 98.3 F (36.8 C) (Oral)    Resp 18    Ht 1.803 m (5\' 11" )    Wt 68 kg    SpO2 98%    BMI 20.92 kg/m   Physical Exam Vitals signs and nursing note reviewed.  Constitutional:      Appearance: He is well-developed. He is not diaphoretic.  HENT:     Head: Normocephalic and atraumatic.     Nose: Nose normal.     Mouth/Throat:     Mouth: Mucous membranes are moist.  Eyes:     General:        Right eye: No discharge.        Left eye: No discharge.     Conjunctiva/sclera: Conjunctivae normal.  Neck:     Musculoskeletal: Normal range of motion.  Cardiovascular:     Rate and Rhythm: Normal rate and regular rhythm.  Pulmonary:     Effort: Pulmonary effort is normal. No respiratory distress.  Abdominal:     General: There is no distension.     Tenderness: There is no abdominal tenderness.  Musculoskeletal:        General: Swelling,  tenderness, deformity and signs of injury present.     Comments: The right forearm has a small wound over the mid forearm over the ulnar surface, no obvious deformity other than swelling at that local area.  Both joints of the wrist and the elbow have good range of motion.  Good pulses at the wrist  Skin:    General: Skin is warm and dry.     Findings: No erythema or rash.  Neurological:     General: No focal deficit present.     Mental Status: He is alert.     Coordination: Coordination normal.     Comments: Normal strength and sensation distal to the injury      ED Treatments / Results  Labs (all labs ordered are listed, but only abnormal results are displayed) Labs Reviewed  SARS CORONAVIRUS 2 (HOSPITAL ORDER, [place] [facility])  CBC WITH DIFFERENTIAL/PLATELET  COMPREHENSIVE METABOLIC PANEL    EKG None  Radiology Dg Forearm Right  Result Date: 10/02/2018 CLINICAL DATA:  Acute RIGHT forearm pain following injury today. Initial encounter. EXAM: RIGHT  FOREARM - 2 VIEW COMPARISON:  None. FINDINGS: Oblique fractures within the mid RIGHT ulna noted with 1 mm dorsal displacement of the proximal oblique fracture. No other fractures, subluxation or dislocation noted. IMPRESSION: Oblique fractures of the mid RIGHT ulna with 1 mm dorsal displacement of the proximal oblique fracture. Electronically Signed   By: Margarette Canada M.D.   On: 10/02/2018 20:34    Procedures Procedures (including critical care time)  Medications Ordered in ED Medications  bacitracin ointment 1 application (has no administration in time range)  ceFAZolin (ANCEF) IVPB 1 g/50 mL premix (has no administration in time range)  HYDROcodone-acetaminophen (NORCO/VICODIN) 5-325 MG per tablet 2 tablet (2 tablets Oral Given 10/02/18 2016)     Initial Impression / Assessment and Plan / ED Course  I have reviewed the triage vital signs and the nursing notes.  Pertinent labs & imaging results that were available during my care of the patient were reviewed by me and considered in my medical decision making (see chart for details).       The patient is well-appearing, at this time he will need an x-ray to rule out fracture, wound care, pain medicine.  This might be a nightstick type injury, may just be a contusion with broken skin.  I have personally viewed the x-ray, there is an open fracture of the mid ulna on the right forearm.  This will be dressed, he will need to have this immobilized, the patient will need to be have an operation, I have discussed his care with Dr. Aline Brochure who wants to admit him to the hospital, has requested 2 g of IV Ancef, the patient will be given pain medication, preop labs, otherwise he is well-appearing  Final Clinical Impressions(s) / ED Diagnoses   Final diagnoses:  Forearm fracture, right, open type I or II, initial encounter    ED Discharge Orders    None       Noemi Chapel, MD 10/02/18 2054

## 2018-10-02 NOTE — ED Notes (Signed)
Pt's right forearm elevated on two pillows

## 2018-10-02 NOTE — ED Triage Notes (Signed)
Pt states he was working with a horse and got kicked in his right arm.  Pt has a laceration to the same arm and a deep abrasion to his left elbow.

## 2018-10-02 NOTE — ED Notes (Signed)
Patient transported to X-ray 

## 2018-10-03 ENCOUNTER — Emergency Department (HOSPITAL_COMMUNITY): Payer: No Typology Code available for payment source

## 2018-10-03 ENCOUNTER — Emergency Department (HOSPITAL_COMMUNITY): Payer: No Typology Code available for payment source | Admitting: Anesthesiology

## 2018-10-03 ENCOUNTER — Encounter (HOSPITAL_COMMUNITY): Payer: Self-pay | Admitting: *Deleted

## 2018-10-03 ENCOUNTER — Encounter (HOSPITAL_COMMUNITY)
Admission: EM | Disposition: A | Discharge status: Home / Self Care | Payer: Self-pay | Source: Home / Self Care | Attending: Emergency Medicine

## 2018-10-03 DIAGNOSIS — S5291XB Unspecified fracture of right forearm, initial encounter for open fracture type I or II: Secondary | ICD-10-CM

## 2018-10-03 HISTORY — PX: I&D EXTREMITY: SHX5045

## 2018-10-03 HISTORY — PX: ORIF ULNAR FRACTURE: SHX5417

## 2018-10-03 SURGERY — OPEN REDUCTION INTERNAL FIXATION (ORIF) ULNAR FRACTURE
Anesthesia: General | Site: Arm Lower | Laterality: Right

## 2018-10-03 MED ORDER — CEFAZOLIN SODIUM-DEXTROSE 2-4 GM/100ML-% IV SOLN
INTRAVENOUS | Status: AC
  Filled 2018-10-03: qty 100

## 2018-10-03 MED ORDER — IBUPROFEN 800 MG PO TABS: 800.0000 mg | ORAL_TABLET | Freq: Three times a day (TID) | ORAL | 1 refills | Status: DC | PRN

## 2018-10-03 MED ORDER — PROPOFOL 10 MG/ML IV BOLUS
INTRAVENOUS | Status: DC | PRN
  Administered 2018-10-03: 100 mg via INTRAVENOUS

## 2018-10-03 MED ORDER — MEPERIDINE HCL 50 MG/ML IJ SOLN: 6.2500 mg | INTRAMUSCULAR | Status: DC | PRN

## 2018-10-03 MED ORDER — CEPHALEXIN 500 MG PO CAPS: 500.0000 mg | ORAL_CAPSULE | Freq: Four times a day (QID) | ORAL | 0 refills | Status: AC

## 2018-10-03 MED ORDER — HYDROMORPHONE HCL 1 MG/ML IJ SOLN: 0.2500 mg | INTRAMUSCULAR | Status: DC | PRN

## 2018-10-03 MED ORDER — LACTATED RINGERS IV SOLN
Freq: Once | INTRAVENOUS | Status: AC
  Administered 2018-10-03: 10:00:00 via INTRAVENOUS

## 2018-10-03 MED ORDER — POVIDONE-IODINE 10 % EX SWAB: 2.0000 "application " | Freq: Once | CUTANEOUS | Status: DC

## 2018-10-03 MED ORDER — GLYCOPYRROLATE PF 0.2 MG/ML IJ SOSY
PREFILLED_SYRINGE | INTRAMUSCULAR | Status: DC | PRN
  Administered 2018-10-03: .2 mg via INTRAVENOUS

## 2018-10-03 MED ORDER — ONDANSETRON HCL 4 MG/2ML IJ SOLN
INTRAMUSCULAR | Status: DC | PRN
  Administered 2018-10-03: 4 mg via INTRAVENOUS

## 2018-10-03 MED ORDER — EPHEDRINE SULFATE 50 MG/ML IJ SOLN
INTRAMUSCULAR | Status: DC | PRN
  Administered 2018-10-03: 10 mg via INTRAVENOUS

## 2018-10-03 MED ORDER — DEXAMETHASONE SODIUM PHOSPHATE 4 MG/ML IJ SOLN
INTRAMUSCULAR | Status: DC | PRN
  Administered 2018-10-03: 8 mg via INTRAVENOUS

## 2018-10-03 MED ORDER — PROPOFOL 10 MG/ML IV BOLUS
INTRAVENOUS | Status: AC
  Filled 2018-10-03: qty 40

## 2018-10-03 MED ORDER — 0.9 % SODIUM CHLORIDE (POUR BTL) OPTIME
TOPICAL | Status: DC | PRN
  Administered 2018-10-03 (×3): 1000 mL

## 2018-10-03 MED ORDER — FENTANYL CITRATE (PF) 250 MCG/5ML IJ SOLN
INTRAMUSCULAR | Status: AC
  Filled 2018-10-03: qty 5

## 2018-10-03 MED ORDER — PROPOFOL 10 MG/ML IV BOLUS: INTRAVENOUS | Status: DC | PRN

## 2018-10-03 MED ORDER — PROMETHAZINE HCL 12.5 MG PO TABS: 12.5000 mg | ORAL_TABLET | Freq: Four times a day (QID) | ORAL | 0 refills | Status: DC | PRN

## 2018-10-03 MED ORDER — CHLORHEXIDINE GLUCONATE 4 % EX LIQD: 60.0000 mL | Freq: Once | CUTANEOUS | Status: DC

## 2018-10-03 MED ORDER — ONDANSETRON HCL 4 MG/2ML IJ SOLN: 4.0000 mg | Freq: Once | INTRAMUSCULAR | Status: DC

## 2018-10-03 MED ORDER — HYDROCODONE-ACETAMINOPHEN 7.5-325 MG PO TABS: 1.0000 | ORAL_TABLET | Freq: Once | ORAL | Status: DC

## 2018-10-03 MED ORDER — FENTANYL CITRATE (PF) 100 MCG/2ML IJ SOLN
INTRAMUSCULAR | Status: DC | PRN
  Administered 2018-10-03 (×2): 50 ug via INTRAVENOUS

## 2018-10-03 MED ORDER — DEXAMETHASONE SODIUM PHOSPHATE 10 MG/ML IJ SOLN
INTRAMUSCULAR | Status: AC
  Filled 2018-10-03: qty 1

## 2018-10-03 MED ORDER — GLYCOPYRROLATE PF 0.2 MG/ML IJ SOSY
PREFILLED_SYRINGE | INTRAMUSCULAR | Status: AC
  Filled 2018-10-03: qty 1

## 2018-10-03 MED ORDER — LIDOCAINE 2% (20 MG/ML) 5 ML SYRINGE
INTRAMUSCULAR | Status: AC
  Filled 2018-10-03: qty 5

## 2018-10-03 MED ORDER — HYDROCODONE-ACETAMINOPHEN 7.5-325 MG PO TABS: 1.0000 | ORAL_TABLET | ORAL | 0 refills | Status: DC | PRN

## 2018-10-03 MED ORDER — LACTATED RINGERS IV SOLN
INTRAVENOUS | Status: DC | PRN
  Administered 2018-10-03 (×2): via INTRAVENOUS

## 2018-10-03 MED ORDER — IBUPROFEN 400 MG PO TABS: 400.0000 mg | ORAL_TABLET | Freq: Once | ORAL | Status: DC

## 2018-10-03 MED ORDER — ONDANSETRON HCL 4 MG/2ML IJ SOLN
INTRAMUSCULAR | Status: AC
  Filled 2018-10-03: qty 2

## 2018-10-03 MED ORDER — GLYCOPYRROLATE PF 0.2 MG/ML IJ SOSY: PREFILLED_SYRINGE | INTRAMUSCULAR | Status: DC | PRN

## 2018-10-03 MED ORDER — EPHEDRINE 5 MG/ML INJ
INTRAVENOUS | Status: AC
  Filled 2018-10-03: qty 10

## 2018-10-03 MED ORDER — BUPIVACAINE-EPINEPHRINE (PF) 0.5% -1:200000 IJ SOLN
INTRAMUSCULAR | Status: DC | PRN
  Administered 2018-10-03: 30 mL via PERINEURAL

## 2018-10-03 MED ORDER — BUPIVACAINE-EPINEPHRINE (PF) 0.5% -1:200000 IJ SOLN
INTRAMUSCULAR | Status: AC
  Filled 2018-10-03: qty 60

## 2018-10-03 SURGICAL SUPPLY — 54 items
BANDAGE ESMARK 4X12 BL STRL LF (DISPOSABLE) ×1 IMPLANT
BIT DRILL 2.5X110 QC LCP DISP (BIT) ×2 IMPLANT
BIT DRILL QC 3.5X110 (BIT) ×2 IMPLANT
BLADE 10 SAFETY STRL DISP (BLADE) ×3 IMPLANT
BNDG COHESIVE 4X5 TAN STRL (GAUZE/BANDAGES/DRESSINGS) ×3 IMPLANT
BNDG ELASTIC 3X5.8 VLCR NS LF (GAUZE/BANDAGES/DRESSINGS) ×2 IMPLANT
BNDG ELASTIC 4X5.8 VLCR NS LF (GAUZE/BANDAGES/DRESSINGS) ×2 IMPLANT
BNDG ESMARK 4X12 BLUE STRL LF (DISPOSABLE) ×3
BNDG GAUZE ELAST 4 BULKY (GAUZE/BANDAGES/DRESSINGS) ×2 IMPLANT
CHLORAPREP W/TINT 26 (MISCELLANEOUS) ×3 IMPLANT
CLOTH BEACON ORANGE TIMEOUT ST (SAFETY) ×3 IMPLANT
COVER LIGHT HANDLE STERIS (MISCELLANEOUS) ×8 IMPLANT
COVER WAND RF STERILE (DRAPES) ×2 IMPLANT
CUFF TOURN SGL QUICK 18X4 (TOURNIQUET CUFF) ×2 IMPLANT
DRAPE C-ARM FOLDED MOBILE STRL (DRAPES) ×3 IMPLANT
ELECT REM PT RETURN 9FT ADLT (ELECTROSURGICAL) ×3
ELECTRODE REM PT RTRN 9FT ADLT (ELECTROSURGICAL) ×1 IMPLANT
GAUZE KERLIX 2X3 DERM STRL LF (GAUZE/BANDAGES/DRESSINGS) ×3 IMPLANT
GAUZE SPONGE 4X4 12PLY STRL (GAUZE/BANDAGES/DRESSINGS) ×3 IMPLANT
GAUZE XEROFORM 5X9 LF (GAUZE/BANDAGES/DRESSINGS) ×2 IMPLANT
GLOVE BIOGEL PI IND STRL 7.0 (GLOVE) ×1 IMPLANT
GLOVE BIOGEL PI INDICATOR 7.0 (GLOVE) ×6
GLOVE ECLIPSE 6.5 STRL STRAW (GLOVE) ×4 IMPLANT
GLOVE SKINSENSE NS SZ8.0 LF (GLOVE) ×2
GLOVE SKINSENSE STRL SZ8.0 LF (GLOVE) ×1 IMPLANT
GLOVE SS N UNI LF 8.5 STRL (GLOVE) ×3 IMPLANT
GOWN STRL REUS W/TWL LRG LVL3 (GOWN DISPOSABLE) ×8 IMPLANT
GOWN STRL REUS W/TWL XL LVL3 (GOWN DISPOSABLE) ×3 IMPLANT
INST SET MINOR BONE (KITS) ×3 IMPLANT
KIT TURNOVER KIT A (KITS) ×3 IMPLANT
MANIFOLD NEPTUNE II (INSTRUMENTS) ×3 IMPLANT
NDL HYPO 21X1.5 SAFETY (NEEDLE) ×1 IMPLANT
NEEDLE HYPO 21X1.5 SAFETY (NEEDLE) ×3 IMPLANT
NS IRRIG 1000ML POUR BTL (IV SOLUTION) ×7 IMPLANT
PACK BASIC LIMB (CUSTOM PROCEDURE TRAY) ×3 IMPLANT
PAD ARMBOARD 7.5X6 YLW CONV (MISCELLANEOUS) ×3 IMPLANT
PROS LCP PLATE 10 137M (Plate) ×3 IMPLANT
PROSTHESIS LCP PLATE 10 137M (Plate) IMPLANT
SCREW CORTEX 3.5 16MM (Screw) ×6 IMPLANT
SCREW CORTEX 3.5 18MM (Screw) ×8 IMPLANT
SCREW CORTEX 3.5 20MM (Screw) ×2 IMPLANT
SCREW LOCK CORT ST 3.5X16 (Screw) IMPLANT
SCREW LOCK CORT ST 3.5X18 (Screw) IMPLANT
SCREW LOCK CORT ST 3.5X20 (Screw) IMPLANT
SET BASIN LINEN APH (SET/KITS/TRAYS/PACK) ×3 IMPLANT
SPLINT J IMMOBILIZER 4X20FT (CAST SUPPLIES) IMPLANT
SPLINT J PLASTER J 4INX20Y (CAST SUPPLIES) ×2
STAPLER VISISTAT 35W (STAPLE) ×3 IMPLANT
SUT ETHILON 3 0 FSL (SUTURE) IMPLANT
SUT MON AB 2-0 SH 27 (SUTURE) ×4
SUT MON AB 2-0 SH27 (SUTURE) ×1 IMPLANT
SYR 10ML LL (SYRINGE) ×3 IMPLANT
TOWEL OR 17X26 4PK STRL BLUE (TOWEL DISPOSABLE) ×2 IMPLANT
WATER STERILE IRR 1000ML POUR (IV SOLUTION) ×3 IMPLANT

## 2018-10-03 NOTE — Transfer of Care (Signed)
Immediate Anesthesia Transfer of Care Note  Patient: Kent Davidson  Procedure(s) Performed: OPEN REDUCTION INTERNAL FIXATION (ORIF) RIGHT ULNAR FRACTURE (Right Arm Lower) IRRIGATION AND DEBRIDEMENT  RIGHT ULNA (Right Arm Lower)  Patient Location: PACU  Anesthesia Type:General  Level of Consciousness: awake, oriented and patient cooperative  Airway & Oxygen Therapy: Patient Spontanous Breathing  Post-op Assessment: Report given to RN and Post -op Vital signs reviewed and stable  Post vital signs: Reviewed and stable  Last Vitals:  Vitals Value Taken Time  BP 136/70 10/03/18 1240  Temp    Pulse 47 10/03/18 1245  Resp 6 10/03/18 1245  SpO2 99 % 10/03/18 1245  Vitals shown include unvalidated device data.  Last Pain:  Vitals:   10/03/18 0842  TempSrc: Oral  PainSc: 8       Patients Stated Pain Goal: 5 ([id number])  Complications: No apparent anesthesia complications

## 2018-10-03 NOTE — Anesthesia Procedure Notes (Signed)
Procedure Name: LMA Insertion Date/Time: 10/03/2018 10:54 AM Performed by: Andree Elk, Amy A, CRNA Pre-anesthesia Checklist: Patient identified, Emergency Drugs available, Suction available, Patient being monitored and Timeout performed Patient Re-evaluated:Patient Re-evaluated prior to induction Oxygen Delivery Method: Circle system utilized Preoxygenation: Pre-oxygenation with 100% oxygen Induction Type: IV induction LMA Size: 4.0 Number of attempts: 1

## 2018-10-03 NOTE — Brief Op Note (Signed)
10/03/2018  12:37 PM  PATIENT:  Kent Davidson  63 y.o. male  PRE-OPERATIVE DIAGNOSIS:  open grade I ulna fracture right arm  POST-OPERATIVE DIAGNOSIS:  open grade I ulna fracture right arm  Findings this was a type I open fracture with a 1 cm wound looks to be inside-out which penetrated the subcutaneous tissue and fascia overlying the musculature.  Directly overlie the fracture.  The fracture itself was clean.  Implants Synthes small fragment set 10 hole plate 2 interfrag screws and plate in neutral mode all nonlocking screws  PROCEDURE:  Procedure(s): OPEN REDUCTION INTERNAL FIXATION (ORIF) RIGHT ULNAR FRACTURE (Right) IRRIGATION AND DEBRIDEMENT  RIGHT ULNA (Right)  SURGEON:  Surgeon(s) and Role:    Carole Civil, MD - Primary  Patient was seen in the preop area the surgical site was confirmed and marked.  A thorough chart review was completed.  The patient was taken to the operating room for general anesthesia followed by initial washing of his arm and then Betadine scrub and paint.  After sterile prep and drape timeout was completed surgical procedure was confirmed and implants and x-rays were checked and available respectively.  The limb was exsanguinated with a 4 inch Esmarch the tourniquet was elevated to 250 mmHg.  The incision was made starting at the distal end of the laceration and then carried proximally from the proximal end of the laceration between the extensor and flexor musculature.  After subcutaneous tissue division sharp and blunt dissection was carried down to bone and the fracture site was opened and irrigated and debrided.  There was no gross contamination.  The open fracture wound was debrided sharply  While irrigating the fracture was noted to have a large butterfly fragment was actually present in both sides of it were irrigated as well.  Bone clamps were placed in a radiograph was confirmed which confirm that the fracture was  reduced  Interfragmentary screws were placed using a 3 5 drill bit countersink to 5 drill bit measure in insertion per AO technique.  The second interfrag screw which is more proximally runs from volar to dorsal and no countersink was used.  This gave excellent reapproximation of the fracture fragments in anatomic position and they were very stable.  The plate was then placed in neutral mode with slight contouring.  3 screws were placed proximally and distally starting at the fracture site and then working towards either in an alternating fashion.  Final radiographs confirmed reduction of the fracture with specific attention to paid to the distal radial ulnar joint and the radial head elbow.  The wounds were irrigated again with for a total of 3 cc of irrigation and the wound was closed with 2-0 Monocryl suture  Sterile dressings were applied a volar splint was applied tourniquet was released prior to placing the splint.  The patient was extubated and taken to recovery room in stable condition  Postoperative plan He can use his hand as tolerated with a lifting restriction less than 5 pounds The staples will come out at 2 weeks Radiographs will be taken at 2 weeks 6 weeks and 12 weeks    PHYSICIAN ASSISTANT:   Assisted by Corrie Dandy ANESTHESIA:   General anesthesia  EBL:  5 mL   BLOOD ADMINISTERED:none  DRAINS: none   LOCAL MEDICATIONS USED: 30 cc of Marcaine  SPECIMEN:  No Specimen  DISPOSITION OF SPECIMEN:  N/A  COUNTS:  YES  TOURNIQUET:   Total Tourniquet Time Documented: Upper Arm (Right) -  66 minutes Total: Upper Arm (Right) - 66 minutes   DICTATION: .Viviann Spare Dictation  PLAN OF CARE: Discharge to home after PACU  PATIENT DISPOSITION:  PACU - hemodynamically stable.   Delay start of Pharmacological VTE agent (>24hrs) due to surgical blood loss or risk of bleeding: not applicable

## 2018-10-03 NOTE — Discharge Instructions (Signed)

## 2018-10-03 NOTE — Anesthesia Postprocedure Evaluation (Signed)
Anesthesia Post Note Late entry 1312  Patient: Redmond Pulling  Procedure(s) Performed: OPEN REDUCTION INTERNAL FIXATION (ORIF) RIGHT ULNAR FRACTURE (Right Arm Lower) IRRIGATION AND DEBRIDEMENT  RIGHT ULNA (Right Arm Lower)  Patient location during evaluation: PACU Anesthesia Type: General Level of consciousness: awake and alert and oriented Pain management: pain level controlled Vital Signs Assessment: post-procedure vital signs reviewed and stable Respiratory status: spontaneous breathing Postop Assessment: no apparent nausea or vomiting Anesthetic complications: no     Last Vitals:  Vitals:   10/03/18 1300 10/03/18 1318  BP: (!) 148/87 (!) 148/75  Pulse: (!) 50 (!) 48  Resp: 11 14  Temp:  36.7 C  SpO2: 100% 97%    Last Pain:  Vitals:   10/03/18 1318  TempSrc: Oral  PainSc: 0-No pain                 ADAMS, AMY A

## 2018-10-03 NOTE — Interval H&P Note (Signed)
History and Physical Interval Note:  10/03/2018 10:42 AM  Kent Davidson  has presented today for surgery, with the diagnosis of open grade I ulna fracture right arm.  The various methods of treatment have been discussed with the patient and family. After consideration of risks, benefits and other options for treatment, the patient has consented to  Procedure(s): OPEN REDUCTION INTERNAL FIXATION (ORIF) RIGHT ULNAR FRACTURE (Right) IRRIGATION AND DEBRIDEMENT  RIGHT ULNA (Right) as a surgical intervention.  The patient's history has been reviewed, patient examined, no change in status, stable for surgery.  I have reviewed the patient's chart and labs.  Questions were answered to the patient's satisfaction.     Arther Abbott

## 2018-10-03 NOTE — H&P (Signed)
HOSPITAL admit [place]    Patient ID: BRAYAN KENNARD, male   DOB: 1955-08-27, 63 y.o.   MRN: [id number]    Chief Complaint  Patient presents with  . Arm Injury, right arm August 27     Jovonta Peche Sardo is a 63 y.o. male.    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  Kicked by horse August 27 came in with open fracture grade I.  Started on antibiotics after irrigation of the wound.  Complains of right forearm pain weakness decreased range of motion.  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  Kicked by horse August 27 came in with open fracture grade I.  Started on antibiotics after irrigation of the wound.  Complains of right forearm pain weakness decreased range of motion.  Rates his pain 8 out of 10 initially now better controlled  Review of Systems (all) Review of Systems  Endo/Heme/Allergies: Bruises/bleeds easily.  All other systems reviewed and are negative.   Past Medical History:  Diagnosis Date  . Gastroesophageal reflux     Past Surgical History:  Procedure Laterality Date  . APPENDECTOMY    . TENDON REPAIR  11/13/2011   Procedure: TENDON REPAIR;  Surgeon: Tennis Must, MD;  Location: [facility];  Service: Orthopedics;  Laterality: Left;  incision and drainage with percutaneous pinning left index finger     No family history on file. Social History Social History   Tobacco Use  . Smoking status: Current Every Day Smoker    Packs/day: 0.50    Types: Cigarettes  Substance Use Topics  . Alcohol use: No  . Drug use: No     Allergies  Allergen Reactions  . Bee Venom Anaphylaxis    Current Facility-Administered Medications  Medication Dose Route Frequency Provider Last Rate Last Dose  . 0.9 %  sodium chloride infusion   Intravenous Continuous Carole Civil, MD 50 mL/hr at 10/02/18 2223    . bacitracin ointment 1 application  1  application Topical BID Noemi Chapel, MD   1 application at [phone]  . ceFAZolin (ANCEF) IVPB 2g/100 mL premix  2 g Intravenous Q8H Carole Civil, MD   Stopped at 10/03/18 [phone]  . HYDROcodone-acetaminophen (NORCO/VICODIN) 5-325 MG per tablet 1 tablet  1 tablet Oral Q4H PRN Carole Civil, MD      . HYDROmorphone (DILAUDID) injection 0.5-1 mg  0.5-1 mg Intravenous Q2H PRN Carole Civil, MD      . ondansetron [facility]) injection 4 mg  4 mg Intravenous Q6H PRN Carole Civil, MD      . oxyCODONE-acetaminophen (PERCOCET/ROXICET) 5-325 MG per tablet 1 tablet  1 tablet Oral Q4H PRN Carole Civil, MD   1 tablet at 10/03/18 0255  . penicillin G potassium 5 Million Units in sodium chloride 0.9 % 250 mL IVPB  5 Million Units Intravenous Q6H Carole Civil, MD   Stopped at 10/03/18 [phone]   No current outpatient medications on file.     Physical Exam(=30) BP 123/80 (BP Location: Right Arm)   Pulse (!) 43   Temp 97.8 F (36.6 C) (Oral)   Resp 15   Ht 5\' 11"  (1.803 m)   Wt 68 kg   SpO2 100%   BMI 20.92 kg/m   Gen. Appearance normal appearance small frame Peripheral vascular system normal without edema lymph nodes axilla negative Gait reported normal not tested  Left Upper extremity  Inspection revealed no malalignment or asymmetry  Assessment of range of motion: Full range of motion was recorded  Assessment of stability: Elbow wrist and hand and shoulder were stable  Assessment of muscle strength and tone revealed grade 5 muscle strength and normal muscle tone  Skin abrasion left elbow   Right upper extremity  Inspection revealed no malalignment tenderness mid forearm no gross deformity Assessment of range of motion: Hand normal shoulder normal elbow normal  assessment of stability: Elbow wrist and hand and shoulder were stable  Assessment of muscle strength and tone deferred secondary to fracture skin just less than 1 cm laceration Skin mid forearm  laceration   Right Lower extremity  Inspection revealed no malalignment or asymmetry  Assessment of range of motion: Full range of motion was recorded  Assessment of stability: Ankle, knee and hip were stable  Assessment of muscle strength and tone revealed grade 5 muscle strength and normal muscle tone  Skin was normal without rash lesion or ulceration  Left lower extremity Inspection revealed no malalignment or asymmetry  Assessment of range of motion: Full range of motion was recorded  Assessment of stability: Ankle, knee and hip were stable  Assessment of muscle strength and tone revealed grade 5 muscle strength and normal muscle tone  Skin was normal without rash lesion or ulceration  Note skin throughout his body had multiple areas of subcutaneous ecchymosis   Coordination was tested by finger-to-nose nose and was deferred Deep tendon reflexes were 2+ lower extremities  Examination of sensation by touch was normal  Mental status  Oriented to time person and place normal  Mood and affect normal without depression anxiety or agitation  Dx:   Data Reviewed  ER RECORD REVIEWED: CONFIRMS HISTORY   I reviewed the following images and the reports and my independent interpretation is images were taken of his forearm he has a midshaft nondisplaced fracture oblique right ulna with normal wrist and elbow  Assessment  Grade 1 open fracture right elbow  Plan   The procedure has been fully reviewed with the patient; The risks and benefits of surgery have been discussed and explained and understood. Alternative treatment has also been reviewed, questions were encouraged and answered. The postoperative plan is also been reviewed.  Open reduction internal fixation with irrigation debridement right elbow  Postoperative plan discharge if stable and pain controlled otherwise observation for 24 hours   Carole Civil MD

## 2018-10-03 NOTE — Anesthesia Preprocedure Evaluation (Addendum)
Anesthesia Evaluation  Patient identified by MRN, date of birth, ID band Patient awake    Reviewed: Allergy & Precautions, NPO status , Patient's Chart, lab work & pertinent test results  Airway Mallampati: II  TM Distance: >3 FB Neck ROM: Full    Dental  (+) Edentulous Upper, Edentulous Lower   Pulmonary Current Smoker and Patient abstained from smoking.,    Pulmonary exam normal breath sounds clear to auscultation       Cardiovascular Exercise Tolerance: Good  Rate:Bradycardia  Marked bradycardia on EKG, No new changes from previous EKG   Neuro/Psych    GI/Hepatic Neg liver ROS, GERD  Medicated,  Endo/Other  negative endocrine ROS  Renal/GU negative Renal ROS     Musculoskeletal   Abdominal   Peds  Hematology negative hematology ROS (+)   Anesthesia Other Findings   Reproductive/Obstetrics                           Anesthesia Physical Anesthesia Plan  ASA: III  Anesthesia Plan: General   Post-op Pain Management:    Induction: Intravenous  PONV Risk Score and Plan:   Airway Management Planned: LMA  Additional Equipment:   Intra-op Plan:   Post-operative Plan: Extubation in OR  Informed Consent: I have reviewed the patients History and Physical, chart, labs and discussed the procedure including the risks, benefits and alternatives for the proposed anesthesia with the patient or authorized representative who has indicated his/her understanding and acceptance.       Plan Discussed with: CRNA  Anesthesia Plan Comments:       Anesthesia Quick Evaluation

## 2018-10-03 NOTE — Brief Op Note (Signed)
10/03/2018  12:29 PM  PATIENT:  Kent Davidson  63 y.o. male  PRE-OPERATIVE DIAGNOSIS:  open grade I ulna fracture right arm  POST-OPERATIVE DIAGNOSIS:  open grade I ulna fracture right arm  Findings this was a type I open fracture with a 1 cm wound looks to be inside-out which penetrated the subcutaneous tissue and fascia overlying the musculature.  Directly overlie the fracture.  The fracture itself was clean.  Implants Synthes small fragment set 10 hole plate 2 interfrag screws and plate in neutral mode all nonlocking screws  PROCEDURE:  Procedure(s): OPEN REDUCTION INTERNAL FIXATION (ORIF) RIGHT ULNAR FRACTURE (Right) IRRIGATION AND DEBRIDEMENT  RIGHT ULNA (Right)  SURGEON:  Surgeon(s) and Role:    Carole Civil, MD - Primary  Patient was seen in the preop area the surgical site was confirmed and marked.  A thorough chart review was completed.  The patient was taken to the operating room for general anesthesia followed by initial washing of his arm and then Betadine scrub and paint.  After sterile prep and drape timeout was completed surgical procedure was confirmed and implants and x-rays were checked and available respectively.  The limb was exsanguinated with a 4 inch Esmarch the tourniquet was elevated to 250 mmHg.  The incision was made starting at the distal end of the laceration and then carried proximally from the proximal end of the laceration between the extensor and flexor musculature.  After subcutaneous tissue division sharp and blunt dissection was carried down to bone and the fracture site was opened and irrigated and debrided.  There was no gross contamination.  The open fracture wound was debrided sharply  While irrigating the fracture was noted to have a large butterfly fragment was actually present in both sides of it were irrigated as well.  Bone clamps were placed in a radiograph was confirmed which confirm that the fracture was  reduced  Interfragmentary screws were placed using a 3 5 drill bit countersink to 5 drill bit measure in insertion per AO technique.  The second interfrag screw which is more proximally runs from volar to dorsal and no countersink was used.  This gave excellent reapproximation of the fracture fragments in anatomic position and they were very stable.  The plate was then placed in neutral mode with slight contouring.  3 screws were placed proximally and distally starting at the fracture site and then working towards either in an alternating fashion.  Final radiographs confirmed reduction of the fracture with specific attention to paid to the distal radial ulnar joint and the radial head elbow.  The wounds were irrigated again with for a total of 3 cc of irrigation and the wound was closed with 2-0 Monocryl suture  Sterile dressings were applied a volar splint was applied tourniquet was released prior to placing the splint.  The patient was extubated and taken to recovery room in stable condition  Postoperative plan He can use his hand as tolerated with a lifting restriction less than 5 pounds The staples will come out at 2 weeks Radiographs will be taken at 2 weeks 6 weeks and 12 weeks    PHYSICIAN ASSISTANT:   Assisted by Corrie Dandy ANESTHESIA:   General anesthesia  EBL:  5 mL   BLOOD ADMINISTERED:none  DRAINS: none   LOCAL MEDICATIONS USED: 30 cc of Marcaine  SPECIMEN:  No Specimen  DISPOSITION OF SPECIMEN:  N/A  COUNTS:  YES  TOURNIQUET:   Total Tourniquet Time Documented: Upper Arm (Right) -  66 minutes Total: Upper Arm (Right) - 66 minutes   DICTATION: .Viviann Spare Dictation  PLAN OF CARE: Discharge to home after PACU  PATIENT DISPOSITION:  PACU - hemodynamically stable.   Delay start of Pharmacological VTE agent (>24hrs) due to surgical blood loss or risk of bleeding: not applicable

## 2018-10-03 NOTE — Progress Notes (Signed)
Awake. Needs to void. Refuses urinal. Up to BR. Voided without difficulty. Tolerated well.

## 2018-10-03 NOTE — Op Note (Signed)
10/03/2018  12:37 PM  PATIENT:  Kent Davidson  63 y.o. male  PRE-OPERATIVE DIAGNOSIS:  open grade I ulna fracture right arm  POST-OPERATIVE DIAGNOSIS:  open grade I ulna fracture right arm  Findings this was a type I open fracture with a 1 cm wound looks to be inside-out which penetrated the subcutaneous tissue and fascia overlying the musculature.  Directly overlie the fracture.  The fracture itself was clean.  Implants Synthes small fragment set 10 hole plate 2 interfrag screws and plate in neutral mode all nonlocking screws  PROCEDURE:  Procedure(s): OPEN REDUCTION INTERNAL FIXATION (ORIF) RIGHT ULNAR FRACTURE (Right) IRRIGATION AND DEBRIDEMENT  RIGHT ULNA (Right)  SURGEON:  Surgeon(s) and Role:    Carole Civil, MD - Primary  Patient was seen in the preop area the surgical site was confirmed and marked.  A thorough chart review was completed.  The patient was taken to the operating room for general anesthesia followed by initial washing of his arm and then Betadine scrub and paint.  After sterile prep and drape timeout was completed surgical procedure was confirmed and implants and x-rays were checked and available respectively.  The limb was exsanguinated with a 4 inch Esmarch the tourniquet was elevated to 250 mmHg.  The incision was made starting at the distal end of the laceration and then carried proximally from the proximal end of the laceration between the extensor and flexor musculature.  After subcutaneous tissue division sharp and blunt dissection was carried down to bone and the fracture site was opened and irrigated and debrided.  There was no gross contamination.  The open fracture wound was debrided sharply  While irrigating the fracture was noted to have a large butterfly fragment was actually present in both sides of it were irrigated as well.  Bone clamps were placed in a radiograph was confirmed which confirm that the fracture was  reduced  Interfragmentary screws were placed using a 3 5 drill bit countersink to 5 drill bit measure in insertion per AO technique.  The second interfrag screw which is more proximally runs from volar to dorsal and no countersink was used.  This gave excellent reapproximation of the fracture fragments in anatomic position and they were very stable.  The plate was then placed in neutral mode with slight contouring.  3 screws were placed proximally and distally starting at the fracture site and then working towards either in an alternating fashion.  Final radiographs confirmed reduction of the fracture with specific attention to paid to the distal radial ulnar joint and the radial head elbow.  The wounds were irrigated again with for a total of 3 cc of irrigation and the wound was closed with 2-0 Monocryl suture  Sterile dressings were applied a volar splint was applied tourniquet was released prior to placing the splint.  The patient was extubated and taken to recovery room in stable condition  Postoperative plan He can use his hand as tolerated with a lifting restriction less than 5 pounds The staples will come out at 2 weeks Radiographs will be taken at 2 weeks 6 weeks and 12 weeks    PHYSICIAN ASSISTANT:   Assisted by Corrie Dandy ANESTHESIA:   General anesthesia  EBL:  5 mL   BLOOD ADMINISTERED:none  DRAINS: none   LOCAL MEDICATIONS USED: 30 cc of Marcaine  SPECIMEN:  No Specimen  DISPOSITION OF SPECIMEN:  N/A  COUNTS:  YES  TOURNIQUET:   Total Tourniquet Time Documented: Upper Arm (Right) -  66 minutes Total: Upper Arm (Right) - 66 minutes   DICTATION: .Viviann Spare Dictation  PLAN OF CARE: Discharge to home after PACU  PATIENT DISPOSITION:  PACU - hemodynamically stable.   Delay start of Pharmacological VTE agent (>24hrs) due to surgical blood loss or risk of bleeding: not applicable

## 2018-10-06 ENCOUNTER — Encounter (HOSPITAL_COMMUNITY): Payer: Self-pay | Admitting: Orthopedic Surgery

## 2018-10-09 DIAGNOSIS — Z8781 Personal history of (healed) traumatic fracture: Secondary | ICD-10-CM | POA: Insufficient documentation

## 2018-10-09 DIAGNOSIS — Z9889 Other specified postprocedural states: Secondary | ICD-10-CM | POA: Insufficient documentation

## 2018-10-10 ENCOUNTER — Encounter: Payer: Self-pay | Admitting: Orthopedic Surgery

## 2018-10-10 ENCOUNTER — Ambulatory Visit (INDEPENDENT_AMBULATORY_CARE_PROVIDER_SITE_OTHER): Payer: Non-veteran care | Admitting: Orthopedic Surgery

## 2018-10-10 VITALS — Ht 71.0 in | Wt 138.0 lb

## 2018-10-10 DIAGNOSIS — S5291XB Unspecified fracture of right forearm, initial encounter for open fracture type I or II: Secondary | ICD-10-CM

## 2018-10-10 DIAGNOSIS — Z8781 Personal history of (healed) traumatic fracture: Secondary | ICD-10-CM

## 2018-10-10 DIAGNOSIS — Z9889 Other specified postprocedural states: Secondary | ICD-10-CM

## 2018-10-10 NOTE — Patient Instructions (Signed)
Continue antibiotics and wear brace  dont lift heavier than 5 lbs

## 2018-10-10 NOTE — Progress Notes (Signed)
Chief Complaint  Patient presents with  . Routine Post Op    10/03/18 left forearm ORIF     This is postop day 6 status post ORIF left ulnar fracture which was an open grade 1 fracture he is on p.o. antibiotics  His wound looks good his arm looks great his swelling is down he is moving all his fingers he is neurovascularly intact  We change his dressing put him in a Velcro splint 5 pound weight limit come back in a week for staples out x-rays and he can go back into the splint

## 2018-10-17 ENCOUNTER — Emergency Department: Payer: Non-veteran care

## 2018-10-17 ENCOUNTER — Ambulatory Visit (INDEPENDENT_AMBULATORY_CARE_PROVIDER_SITE_OTHER): Payer: Non-veteran care | Admitting: Orthopedic Surgery

## 2018-10-17 VITALS — BP 120/67 | HR 57 | Temp 97.2°F | Ht 71.0 in | Wt 138.0 lb

## 2018-10-17 DIAGNOSIS — Z9889 Other specified postprocedural states: Secondary | ICD-10-CM

## 2018-10-17 DIAGNOSIS — S5291XB Unspecified fracture of right forearm, initial encounter for open fracture type I or II: Secondary | ICD-10-CM

## 2018-10-17 DIAGNOSIS — Z8781 Personal history of (healed) traumatic fracture: Secondary | ICD-10-CM

## 2018-10-17 NOTE — Progress Notes (Signed)
Postop  Fracture care  ORIF right ulnar fracture open grade 1  Antibiotic Keflex  August 28 date of surgery  Global period August 28th to Nov 28   Wound CLEAN   xrays FRACTX STABLE   Brace removable Velcro splint  X-ray at 6 weeks postop   Encounter Diagnoses  Name Primary?  . S/P ORIF (open reduction internal fixation) fracture right forearm 10/03/18 Yes  . Forearm fracture, right, open type I or II, initial encounter 10/03/18

## 2018-11-14 ENCOUNTER — Emergency Department: Payer: Non-veteran care

## 2018-11-14 ENCOUNTER — Ambulatory Visit (INDEPENDENT_AMBULATORY_CARE_PROVIDER_SITE_OTHER): Payer: No Typology Code available for payment source | Admitting: Orthopedic Surgery

## 2018-11-14 VITALS — BP 119/79 | HR 52 | Ht 71.0 in | Wt 138.0 lb

## 2018-11-14 DIAGNOSIS — S5291XD Unspecified fracture of right forearm, subsequent encounter for closed fracture with routine healing: Secondary | ICD-10-CM

## 2018-11-14 NOTE — Progress Notes (Signed)
Postop  Chief Complaint  Patient presents with  . Follow-up    Recheck right forearm fracture, DOS 10-03-18.    Status post ORIF right ulna with plate and screws  6-week x-ray  Encounter Diagnosis  Name Primary?  . Closed fracture of right forearm with routine healing, subsequent encounter Yes    Looking good  -wound good -rom normal  -no fracture pain   xr again 6 weeks

## 2018-11-19 ENCOUNTER — Encounter (HOSPITAL_COMMUNITY): Payer: Self-pay | Admitting: Emergency Medicine

## 2018-11-19 ENCOUNTER — Encounter (HOSPITAL_COMMUNITY)
Admission: EM | Disposition: A | Discharge status: Home / Self Care | Payer: Self-pay | Source: Home / Self Care | Attending: Internal Medicine

## 2018-11-19 ENCOUNTER — Emergency Department (HOSPITAL_COMMUNITY): Payer: No Typology Code available for payment source

## 2018-11-19 ENCOUNTER — Inpatient Hospital Stay (HOSPITAL_COMMUNITY)
Admission: EM | Admit: 2018-11-19 | Discharge: 2018-11-21 | DRG: 308 | Disposition: A | Discharge status: Home / Self Care | Payer: No Typology Code available for payment source | Attending: Internal Medicine | Admitting: Internal Medicine

## 2018-11-19 ENCOUNTER — Other Ambulatory Visit: Payer: Self-pay

## 2018-11-19 DIAGNOSIS — I5021 Acute systolic (congestive) heart failure: Secondary | ICD-10-CM | POA: Diagnosis present

## 2018-11-19 DIAGNOSIS — K229 Disease of esophagus, unspecified: Secondary | ICD-10-CM

## 2018-11-19 DIAGNOSIS — R1314 Dysphagia, pharyngoesophageal phase: Secondary | ICD-10-CM | POA: Diagnosis present

## 2018-11-19 DIAGNOSIS — K228 Other specified diseases of esophagus: Secondary | ICD-10-CM | POA: Diagnosis not present

## 2018-11-19 DIAGNOSIS — Z79899 Other long term (current) drug therapy: Secondary | ICD-10-CM

## 2018-11-19 DIAGNOSIS — K269 Duodenal ulcer, unspecified as acute or chronic, without hemorrhage or perforation: Secondary | ICD-10-CM | POA: Diagnosis present

## 2018-11-19 DIAGNOSIS — Z20828 Contact with and (suspected) exposure to other viral communicable diseases: Secondary | ICD-10-CM | POA: Diagnosis present

## 2018-11-19 DIAGNOSIS — K449 Diaphragmatic hernia without obstruction or gangrene: Secondary | ICD-10-CM | POA: Diagnosis present

## 2018-11-19 DIAGNOSIS — Z91030 Bee allergy status: Secondary | ICD-10-CM

## 2018-11-19 DIAGNOSIS — T18108A Unspecified foreign body in esophagus causing other injury, initial encounter: Secondary | ICD-10-CM | POA: Diagnosis not present

## 2018-11-19 DIAGNOSIS — K2100 Gastro-esophageal reflux disease with esophagitis, without bleeding: Secondary | ICD-10-CM | POA: Diagnosis present

## 2018-11-19 DIAGNOSIS — T18128A Food in esophagus causing other injury, initial encounter: Secondary | ICD-10-CM | POA: Diagnosis not present

## 2018-11-19 DIAGNOSIS — I110 Hypertensive heart disease with heart failure: Secondary | ICD-10-CM | POA: Diagnosis present

## 2018-11-19 DIAGNOSIS — Z9049 Acquired absence of other specified parts of digestive tract: Secondary | ICD-10-CM

## 2018-11-19 DIAGNOSIS — F17210 Nicotine dependence, cigarettes, uncomplicated: Secondary | ICD-10-CM | POA: Diagnosis present

## 2018-11-19 DIAGNOSIS — I4891 Unspecified atrial fibrillation: Principal | ICD-10-CM | POA: Diagnosis present

## 2018-11-19 DIAGNOSIS — K222 Esophageal obstruction: Secondary | ICD-10-CM | POA: Diagnosis present

## 2018-11-19 DIAGNOSIS — R1310 Dysphagia, unspecified: Secondary | ICD-10-CM

## 2018-11-19 DIAGNOSIS — K224 Dyskinesia of esophagus: Secondary | ICD-10-CM | POA: Diagnosis not present

## 2018-11-19 DIAGNOSIS — B3781 Candidal esophagitis: Secondary | ICD-10-CM | POA: Diagnosis present

## 2018-11-19 DIAGNOSIS — I10 Essential (primary) hypertension: Secondary | ICD-10-CM

## 2018-11-19 DIAGNOSIS — K2980 Duodenitis without bleeding: Secondary | ICD-10-CM | POA: Diagnosis present

## 2018-11-19 HISTORY — PX: ESOPHAGEAL DILATION: SHX303

## 2018-11-19 HISTORY — PX: ESOPHAGOGASTRODUODENOSCOPY: SHX5428

## 2018-11-19 LAB — COMPREHENSIVE METABOLIC PANEL
ALT: 25 U/L (ref 0–44)
AST: 41 U/L (ref 15–41)
Albumin: 4.3 g/dL (ref 3.5–5.0)
Alkaline Phosphatase: 78 U/L (ref 38–126)
Anion gap: 11 (ref 5–15)
BUN: 23 mg/dL (ref 8–23)
CO2: 24 mmol/L (ref 22–32)
Calcium: 9.2 mg/dL (ref 8.9–10.3)
Chloride: 105 mmol/L (ref 98–111)
Creatinine, Ser: 1.03 mg/dL (ref 0.61–1.24)
GFR calc Af Amer: >60 mL/min (ref >60)
GFR calc non Af Amer: >60 mL/min (ref >60)
Glucose, Bld: 101 mg/dL — ABNORMAL HIGH (ref 70–99)
Potassium: 3.6 mmol/L (ref 3.5–5.1)
Sodium: 140 mmol/L (ref 135–145)
Total Bilirubin: 0.7 mg/dL (ref 0.3–1.2)
Total Protein: 7.3 g/dL (ref 6.5–8.1)

## 2018-11-19 LAB — CBC WITH DIFFERENTIAL/PLATELET
Abs Immature Granulocytes: 0.01 10*3/uL (ref 0.00–0.07)
Basophils Absolute: 0 10*3/uL (ref 0.0–0.1)
Basophils Relative: 1 %
Eosinophils Absolute: 0 10*3/uL (ref 0.0–0.5)
Eosinophils Relative: 1 %
HCT: 45.5 % (ref 39.0–52.0)
Hemoglobin: 15 g/dL (ref 13.0–17.0)
Immature Granulocytes: 0 %
Lymphocytes Relative: 24 %
Lymphs Abs: 1.4 10*3/uL (ref 0.7–4.0)
MCH: 31.3 pg (ref 26.0–34.0)
MCHC: 33 g/dL (ref 30.0–36.0)
MCV: 94.8 fL (ref 80.0–100.0)
Monocytes Absolute: 0.5 10*3/uL (ref 0.1–1.0)
Monocytes Relative: 9 %
Neutro Abs: 3.7 10*3/uL (ref 1.7–7.7)
Neutrophils Relative %: 65 %
Platelets: 290 10*3/uL (ref 150–400)
RBC: 4.8 MIL/uL (ref 4.22–5.81)
RDW: 13.7 % (ref 11.5–15.5)
WBC: 5.6 10*3/uL (ref 4.0–10.5)
nRBC: 0 % (ref 0.0–0.2)

## 2018-11-19 LAB — SARS CORONAVIRUS 2 BY RT PCR (HOSPITAL ORDER, PERFORMED IN ~~LOC~~ HOSPITAL LAB): SARS Coronavirus 2: NEGATIVE

## 2018-11-19 SURGERY — EGD (ESOPHAGOGASTRODUODENOSCOPY)
Anesthesia: Moderate Sedation

## 2018-11-19 MED ORDER — MEPERIDINE HCL 50 MG/ML IJ SOLN
INTRAMUSCULAR | Status: AC
  Filled 2018-11-19: qty 1

## 2018-11-19 MED ORDER — STERILE WATER FOR IRRIGATION IR SOLN
Status: DC | PRN
  Administered 2018-11-19: 21:00:00

## 2018-11-19 MED ORDER — MIDAZOLAM HCL 5 MG/5ML IJ SOLN
INTRAMUSCULAR | Status: AC
  Filled 2018-11-19: qty 10

## 2018-11-19 MED ORDER — LIDOCAINE VISCOUS HCL 2 % MT SOLN
OROMUCOSAL | Status: AC
  Filled 2018-11-19: qty 15

## 2018-11-19 MED ORDER — MEPERIDINE HCL 50 MG/ML IJ SOLN
INTRAMUSCULAR | Status: DC | PRN
  Administered 2018-11-19 (×2): 25 mg via INTRAVENOUS

## 2018-11-19 MED ORDER — SODIUM CHLORIDE 0.9 % IV BOLUS
1000.0000 mL | Freq: Once | INTRAVENOUS | Status: AC
  Administered 2018-11-19: 18:00:00 1000 mL via INTRAVENOUS

## 2018-11-19 MED ORDER — LIDOCAINE VISCOUS HCL 2 % MT SOLN
OROMUCOSAL | Status: DC | PRN
  Administered 2018-11-19: 1 via OROMUCOSAL

## 2018-11-19 MED ORDER — MIDAZOLAM HCL 5 MG/5ML IJ SOLN
INTRAMUSCULAR | Status: DC | PRN
  Administered 2018-11-19: 2 mg via INTRAVENOUS
  Administered 2018-11-19: 1 mg via INTRAVENOUS
  Administered 2018-11-19: 2 mg via INTRAVENOUS

## 2018-11-19 MED ORDER — GLUCAGON HCL RDNA (DIAGNOSTIC) 1 MG IJ SOLR
1.0000 mg | Freq: Once | INTRAMUSCULAR | Status: AC
  Administered 2018-11-19: 17:00:00 1 mg via INTRAVENOUS
  Filled 2018-11-19: qty 1

## 2018-11-19 NOTE — Op Note (Signed)
[facility] Patient Name: Kent Davidson Procedure Date: 11/19/2018 9:01 PM MRN: [id number] Date of Birth: February 18, 1955 Attending MD: Hildred Laser , MD CSN: [id number] Age: 63 Admit Type: Outpatient Procedure:                Upper GI endoscopy Indications:              Foreign body in the esophagus Providers:                Hildred Laser, MD, Lurline Del, RN, Nelma Rothman,                            Technician Referring MD:             Maudry Diego, MD Medicines:                Lidocaine spray, Meperidine 50 mg IV, Midazolam 5                            mg IV Complications:            No immediate complications. Estimated Blood Loss:     Estimated blood loss was minimal. Procedure:                Pre-Anesthesia Assessment:                           - Prior to the procedure, a History and Physical                            was performed, and patient medications and                            allergies were reviewed. The patient's tolerance of                            previous anesthesia was also reviewed. The risks                            and benefits of the procedure and the sedation                            options and risks were discussed with the patient.                            All questions were answered, and informed consent                            was obtained. Prior Anticoagulants: The patient has                            taken no previous anticoagulant or antiplatelet                            agents. ASA Grade Assessment: II - A patient with  mild systemic disease. After reviewing the risks                            and benefits, the patient was deemed in                            satisfactory condition to undergo the procedure.                           After obtaining informed consent, the endoscope was                            passed under direct vision. Throughout the                            procedure, the  patient's blood pressure, pulse, and                            oxygen saturations were monitored continuously. The                            [id number]) scope was introduced through the                            mouth, and advanced to the second part of duodenum.                            The upper GI endoscopy was technically difficult                            and complex due to presence of foreign body. The                            patient tolerated the procedure well. Scope In: 9:21:26 PM Scope Out: 10:04:02 PM Total Procedure Duration: 0 hours 42 minutes 36 seconds  Findings:      The lumen of the mid esophagus and distal esophagus was mildly dilated.      Food was found in the mid esophagus. Removal was accomplished with a       basket and Roth net.      Abnormal motility was noted in the mid esophagus and in the distal       esophagus. The cricopharyngeus was normal. The distal esophagus/lower       esophageal sphincter is spastic, but gives up passage to the endoscope.       A TTS dilator was passed through the scope. Dilation with a 15-16.5-18       mm balloon dilator was performed to 15 mm, 16.5 mm and 18 mm. The       dilation site was examined and showed mild mucosal disruption, mild       improvement in luminal narrowing and no perforation.      Patchy plaques were found in the distal esophagus.      The Z-line was regular and was found 40 cm from the incisors.      A 2 cm hiatal hernia was present.  The entire examined stomach was normal.      A few erosions were found in the duodenal bulb.      The second portion of the duodenum was normal. Impression:               - Dilation in the mid esophagus and in the distal                            esophagus.                           - Food was found in the esophagus.Very large piece.                            Removal was time consuming but successful.                           - Abnormal esophageal  motility, consistent with                            esophageal spastic segment. Dilated.                           - Esophageal plaques were found, suspicious for                            candidiasis.                           - Z-line regular, 40 cm from the incisors.                           - 2 cm hiatal hernia.                           - Normal stomach.                           - Duodenal erosions.                           - Normal second portion of the duodenum. Moderate Sedation:      Moderate (conscious) sedation was administered by the endoscopy nurse       and supervised by the endoscopist. The following parameters were       monitored: oxygen saturation, heart rate, blood pressure, CO2       capnography and response to care. Total physician intraservice time was       49 minutes. Recommendation:           - Admit the patient to hospital ward new onset                            Atrial fibrillation.                           - Clear liquid diet today.                           -  Use Protonix (pantoprazole) 40 mg IV BID today.                           - Mycostatin suspension 500,000 units qid.                           - Esophagogram in am.                           - No anti-coagulant for 72 hours.                           - H. Pylori serology. Procedure Code(s):        --- Professional ---                           [phone], Esophagogastroduodenoscopy, flexible,                            transoral; with removal of foreign body(s)                           43249, Esophagogastroduodenoscopy, flexible,                            transoral; with transendoscopic balloon dilation of                            esophagus (less than 30 mm diameter)                           99153, Moderate sedation; each additional 15                            minutes intraservice time                           99153, Moderate sedation; each additional 15                            minutes  intraservice time                           [id number], Moderate sedation services provided by the                            same physician or other qualified health care                            professional performing a gastrointestinal                            endoscopic service that sedation supports,                            requiring the presence of an independent trained  observer to assist in the monitoring of the                            patient's level of consciousness and physiological                            status; initial 15 minutes of intra-service time;                            patient age 15 years or older (additional time may                            be reported with [id number], as appropriate) Diagnosis Code(s):        --- Professional ---                           K22.8, Other specified diseases of esophagus                           [phone], Food in esophagus causing other injury,                            initial encounter                           K22.4, Dyskinesia of esophagus                           K22.9, Disease of esophagus, unspecified                           K44.9, Diaphragmatic hernia without obstruction or                            gangrene                           K26.9, Duodenal ulcer, unspecified as acute or                            chronic, without hemorrhage or perforation                           T18.108A, Unspecified foreign body in esophagus                            causing other injury, initial encounter CPT copyright 2019 [employer]. All rights reserved. The codes documented in this report are preliminary and upon coder review may  be revised to meet current compliance requirements. Hildred Laser, MD Hildred Laser, MD 11/19/2018 10:23:33 PM This report has been signed electronically. Number of Addenda: 0

## 2018-11-19 NOTE — ED Notes (Signed)
ED TO INPATIENT HANDOFF REPORT  ED Nurse Name and Phone #: [phone]  S Name/Age/Gender Redmond Pulling 63 y.o. male Room/Bed: APA02/APA02  Code Status   Code Status: Not on file  Home/SNF/Other Home Patient oriented to: situation Is this baseline? Yes   Triage Complete: Triage complete  Chief Complaint Emesis  Triage Note Patient complaining of difficulty swallowing since Saturday after eating steak. States "when I drink water it just comes right back up. It feels like I might have something stuck in my throat but I don't." States he is unable to keep anything down since Saturday.   Allergies Allergies  Allergen Reactions  . Bee Venom Anaphylaxis    Level of Care/Admitting Diagnosis ED Disposition    ED Disposition Condition Comment   Admit  The patient appears reasonably stabilized for admission considering the current resources, flow, and capabilities available in the ED at this time, and I doubt any other [facility] requiring further screening and/or treatment in the ED prior to admission is  present.       B Medical/Surgery History Past Medical History:  Diagnosis Date  . Gastroesophageal reflux    Past Surgical History:  Procedure Laterality Date  . APPENDECTOMY    . I&D EXTREMITY Right 10/03/2018   Procedure: IRRIGATION AND DEBRIDEMENT  RIGHT ULNA;  Surgeon: Carole Civil, MD;  Location: [facility];  Service: Orthopedics;  Laterality: Right;  . ORIF ULNAR FRACTURE Right 10/03/2018   Procedure: OPEN REDUCTION INTERNAL FIXATION (ORIF) RIGHT ULNAR FRACTURE;  Surgeon: Carole Civil, MD;  Location: [facility];  Service: Orthopedics;  Laterality: Right;  . TENDON REPAIR  11/13/2011   Procedure: TENDON REPAIR;  Surgeon: Tennis Must, MD;  Location: [place];  Service: Orthopedics;  Laterality: Left;  incision and drainage with percutaneous pinning left index finger      A IV Location/Drains/Wounds Patient Lines/Drains/Airways Status    Active Line/Drains/Airways    Name:   Placement date:   Placement time:   Site:   Days:   Peripheral IV 11/19/18 Left;Lateral Antecubital   11/19/18    1714    Antecubital   less than 1   Incision (Closed) 10/03/18 Arm Right   10/03/18    1220     47          Intake/Output Last 24 hours  Intake/Output Summary (Last 24 hours) at 11/19/2018 1952 Last data filed at 11/19/2018 1854 Gross per 24 hour  Intake 1000 ml  Output -  Net 1000 ml    Labs/Imaging Results for orders placed or performed during the hospital encounter of 11/19/18 (from the past 48 hour(s))  SARS Coronavirus 2 by RT PCR (hospital order, performed in [place] hospital lab) Nasopharyngeal Nasopharyngeal Swab     Status: None   Collection Time: 11/19/18  4:17 PM   Specimen: Nasopharyngeal Swab  Result Value Ref Range   SARS Coronavirus 2 NEGATIVE NEGATIVE    Comment: (NOTE) If result is NEGATIVE SARS-CoV-2 target nucleic acids are NOT DETECTED. The SARS-CoV-2 RNA is generally detectable in upper and lower  respiratory specimens during the acute phase of infection. The lowest  concentration of SARS-CoV-2 viral copies this assay can detect is 250  copies / mL. A negative result does not preclude SARS-CoV-2 infection  and should not be used as the sole basis for treatment or other  patient management decisions.  A negative result may occur with  improper specimen collection / handling, submission of specimen other  than nasopharyngeal swab, presence of viral mutation(s) within the  areas targeted by this assay, and inadequate number of viral copies  (<250 copies / mL). A negative result must be combined with clinical  observations, patient history, and epidemiological information. If result is POSITIVE SARS-CoV-2 target nucleic acids are DETECTED. The SARS-CoV-2 RNA is generally detectable in upper and lower  respiratory specimens dur ing the acute phase of infection.  Positive  results are indicative of  active infection with SARS-CoV-2.  Clinical  correlation with patient history and other diagnostic information is  necessary to determine patient infection status.  Positive results do  not rule out bacterial infection or co-infection with other viruses. If result is PRESUMPTIVE POSTIVE SARS-CoV-2 nucleic acids MAY BE PRESENT.   A presumptive positive result was obtained on the submitted specimen  and confirmed on repeat testing.  While 2019 novel coronavirus  (SARS-CoV-2) nucleic acids may be present in the submitted sample  additional confirmatory testing may be necessary for epidemiological  and / or clinical management purposes  to differentiate between  SARS-CoV-2 and other Sarbecovirus currently known to infect humans.  If clinically indicated additional testing with an alternate test  methodology [phone]) is advised. The SARS-CoV-2 RNA is generally  detectable in upper and lower respiratory sp ecimens during the acute  phase of infection. The expected result is Negative. Fact Sheet for Patients:  StrictlyIdeas.no Fact Sheet for Healthcare Providers: BankingDealers.co.za This test is not yet approved or cleared by the [place] FDA and has been authorized for detection and/or diagnosis of SARS-CoV-2 by FDA under an Emergency Use Authorization (EUA).  This EUA will remain in effect (meaning this test can be used) for the duration of the COVID-19 declaration under Section 564(b)(1) of the Act, 21 U.S.C. section 360bbb-3(b)(1), unless the authorization is terminated or revoked sooner. Performed at [facility], [address], [place], [place] [address]   CBC with Differential/Platelet     Status: None   Collection Time: 11/19/18  4:38 PM  Result Value Ref Range   WBC 5.6 4.0 - 10.5 K/uL   RBC 4.80 4.22 - 5.81 MIL/uL   Hemoglobin 15.0 13.0 - 17.0 g/dL   HCT 45.5 39.0 - 52.0 %   MCV 94.8 80.0 - 100.0 fL   MCH 31.3 26.0 - 34.0  pg   MCHC 33.0 30.0 - 36.0 g/dL   RDW 13.7 11.5 - 15.5 %   Platelets 290 150 - 400 K/uL   nRBC 0.0 0.0 - 0.2 %   Neutrophils Relative % 65 %   Neutro Abs 3.7 1.7 - 7.7 K/uL   Lymphocytes Relative 24 %   Lymphs Abs 1.4 0.7 - 4.0 K/uL   Monocytes Relative 9 %   Monocytes Absolute 0.5 0.1 - 1.0 K/uL   Eosinophils Relative 1 %   Eosinophils Absolute 0.0 0.0 - 0.5 K/uL   Basophils Relative 1 %   Basophils Absolute 0.0 0.0 - 0.1 K/uL   Immature Granulocytes 0 %   Abs Immature Granulocytes 0.01 0.00 - 0.07 K/uL    Comment: Performed at [facility], [address], [place], [place] [address]  Comprehensive metabolic panel     Status: Abnormal   Collection Time: 11/19/18  4:38 PM  Result Value Ref Range   Sodium 140 135 - 145 mmol/L   Potassium 3.6 3.5 - 5.1 mmol/L   Chloride 105 98 - 111 mmol/L   CO2 24 22 - 32 mmol/L   Glucose, Bld 101 (H) 70 -  99 mg/dL   BUN 23 8 - 23 mg/dL   Creatinine, Ser 1.03 0.61 - 1.24 mg/dL   Calcium 9.2 8.9 - 10.3 mg/dL   Total Protein 7.3 6.5 - 8.1 g/dL   Albumin 4.3 3.5 - 5.0 g/dL   AST 41 15 - 41 U/L   ALT 25 0 - 44 U/L   Alkaline Phosphatase 78 38 - 126 U/L   Total Bilirubin 0.7 0.3 - 1.2 mg/dL   GFR calc non Af Amer >60 >60 mL/min   GFR calc Af Amer >60 >60 mL/min   Anion gap 11 5 - 15    Comment: Performed at [facility], [address], [place], [place] [address]   Dg Chest Portable 1 View  Result Date: 11/19/2018 CLINICAL DATA:  Weakness, difficulty swallowing. EXAM: PORTABLE CHEST 1 VIEW COMPARISON:  10/02/2018 FINDINGS: Cardiomediastinal contours pulmonary vasculature are unremarkable. Lungs are clear. Spinal degenerative change without acute bone finding. Metallic fragments projecting over left axilla unchanged from previous study. IMPRESSION: No signs of acute cardiopulmonary disease. Electronically Signed   By: Zetta Bills M.D.   On: 11/19/2018 17:15    Pending Labs Unresulted Labs (From admission, onward)   None       Vitals/Pain Today's Vitals   11/19/18 1700 11/19/18 1800 11/19/18 1830 11/19/18 1914  BP: (!) 129/93 (!) 119/105 129/86 (!) 126/96  Pulse:  97 (!) 56 97  Resp:      Temp:      TempSrc:      SpO2:  100% 98% 98%  Weight:      Height:      PainSc:        Isolation Precautions Airborne and Contact precautions  Medications Medications  sodium chloride 0.9 % bolus 1,000 mL (0 mLs Intravenous Stopped 11/19/18 1854)  glucagon (human recombinant) (GLUCAGEN) injection 1 mg (1 mg Intravenous Given 11/19/18 1716)    Mobility walks Low fall risk   Focused Assessments    R Recommendations: See Admitting Provider Note  Report given to:   Additional Notes:

## 2018-11-19 NOTE — ED Provider Notes (Addendum)
[facility] EMERGENCY DEPARTMENT Provider Note   CSN: [id number] Arrival date & time: 11/19/18  1510     History   Chief Complaint Chief Complaint  Patient presents with  . Dysphagia    HPI Kent Davidson is a 63 y.o. male.     Patient states that he was eating steak Saturday and has not been able to eat anything or drink anything since then.  He feels like it stuck in his throat.  Patient cannot tell me why he waited 4 days to come in  The history is provided by the patient. No language interpreter was used.  Weakness Severity:  Moderate Onset quality:  Sudden Timing:  Constant Progression:  Worsening Chronicity:  New Context: not alcohol use   Relieved by:  Nothing Worsened by:  Nothing Ineffective treatments:  None tried Associated symptoms: no abdominal pain, no chest pain, no cough, no diarrhea, no frequency, no headaches and no seizures     Past Medical History:  Diagnosis Date  . Gastroesophageal reflux     Patient Active Problem List   Diagnosis Date Noted  . S/P ORIF (open reduction internal fixation) fracture right elbow 10/03/18 10/09/2018  . Forearm fracture, right, open type I or II, initial encounter 10/03/18     Past Surgical History:  Procedure Laterality Date  . APPENDECTOMY    . I&D EXTREMITY Right 10/03/2018   Procedure: IRRIGATION AND DEBRIDEMENT  RIGHT ULNA;  Surgeon: Carole Civil, MD;  [facility]: [facility];  Service: Orthopedics;  Laterality: Right;  . ORIF ULNAR FRACTURE Right 10/03/2018   Procedure: OPEN REDUCTION INTERNAL FIXATION (ORIF) RIGHT ULNAR FRACTURE;  Surgeon: Carole Civil, MD;  Location[facility];  Service: Orthopedics;  Laterality: Right;  . TENDON REPAIR  11/13/2011   Procedure: TENDON REPAIR;  Surgeon: Tennis Must, MD;  Location: [place];  Service: Orthopedics;  Laterality: Left;  incision and drainage with percutaneous pinning left index finger         Home Medications    Prior to  Admission medications   Medication Sig Start Date End Date Taking? Authorizing Provider  pantoprazole (PROTONIX) 20 MG tablet Take 20 mg by mouth daily.   Yes [provider]    Family History History reviewed. No pertinent family history.  Social History Social History   Tobacco Use  . Smoking status: Current Every Day Smoker    Packs/day: 0.50    Types: Cigarettes  . Smokeless tobacco: Current User    Types: Snuff  Substance Use Topics  . Alcohol use: No  . Drug use: No     Allergies   Bee venom   Review of Systems Review of Systems  Constitutional: Negative for appetite change and fatigue.  HENT: Negative for congestion, ear discharge and sinus pressure.        Throat pain  Eyes: Negative for discharge.  Respiratory: Negative for cough.   Cardiovascular: Negative for chest pain.  Gastrointestinal: Negative for abdominal pain and diarrhea.  Genitourinary: Negative for frequency and hematuria.  Musculoskeletal: Negative for back pain.  Skin: Negative for rash.  Neurological: Positive for weakness. Negative for seizures and headaches.  Psychiatric/Behavioral: Negative for hallucinations.     Physical Exam Updated Vital Signs BP (!) 126/96   Pulse 97   Temp 98.3 F (36.8 C) (Oral)   Resp 18   Ht 5\' 11"  (1.803 m)   Wt 62.6 kg   SpO2 98%   BMI 19.25 kg/m   Physical  Exam Vitals signs and nursing note reviewed.  Constitutional:      Appearance: He is well-developed.  HENT:     Head: Normocephalic.     Nose: Nose normal.  Eyes:     General: No scleral icterus.    Conjunctiva/sclera: Conjunctivae normal.  Neck:     Musculoskeletal: Neck supple.     Thyroid: No thyromegaly.  Cardiovascular:     Rate and Rhythm: Normal rate and regular rhythm.     Heart sounds: No murmur. No friction rub. No gallop.   Pulmonary:     Breath sounds: No stridor. No wheezing or rales.  Chest:     Chest wall: No tenderness.  Abdominal:     General: There is no  distension.     Tenderness: There is no abdominal tenderness. There is no rebound.  Musculoskeletal: Normal range of motion.  Lymphadenopathy:     Cervical: No cervical adenopathy.  Skin:    Findings: No erythema or rash.  Neurological:     Mental Status: He is alert and oriented to person, place, and time.     Motor: No abnormal muscle tone.     Coordination: Coordination normal.  Psychiatric:        Behavior: Behavior normal.      ED Treatments / Results  Labs (all labs ordered are listed, but only abnormal results are displayed) Labs Reviewed  COMPREHENSIVE METABOLIC PANEL - Abnormal; Notable for the following components:      Result Value   Glucose, Bld 101 (*)    All other components within normal limits  SARS CORONAVIRUS 2 BY RT PCR (HOSPITAL ORDER, [place] LAB)  CBC WITH DIFFERENTIAL/PLATELET    EKG None  Radiology Dg Chest Portable 1 View  Result Date: 11/19/2018 CLINICAL DATA:  Weakness, difficulty swallowing. EXAM: PORTABLE CHEST 1 VIEW COMPARISON:  10/02/2018 FINDINGS: Cardiomediastinal contours pulmonary vasculature are unremarkable. Lungs are clear. Spinal degenerative change without acute bone finding. Metallic fragments projecting over left axilla unchanged from previous study. IMPRESSION: No signs of acute cardiopulmonary disease. Electronically Signed   By: Zetta Bills M.D.   On: 11/19/2018 17:15    Procedures Procedures (including critical care time)  Medications Ordered in ED Medications  sodium chloride 0.9 % bolus 1,000 mL (0 mLs Intravenous Stopped 11/19/18 1854)  glucagon (human recombinant) (GLUCAGEN) injection 1 mg (1 mg Intravenous Given 11/19/18 1716)     Initial Impression / Assessment and Plan / ED Course  I have reviewed the triage vital signs and the nursing notes.  Pertinent labs & imaging results that were available during my care of the patient were reviewed by me and considered in my medical decision  making (see chart for details).        Patient given glucagon.  Approximately 20 minutes after glucagon the patient attempted to drink water and he was not able to keep it down.  Patient has a meat impaction in his esophagus and GI will come to endoscopy on him.     Patient was found to have a very large meat impaction in his esophagus which was removed.  Dr. Melony Overly wanted the patient admitted to medicine and he will get a barium swallow tomorrow to evaluate his esophagus further.  No anticoagulation for 3 days Final Clinical Impressions(s) / ED Diagnoses   Final diagnoses:  Esophageal obstruction due to food impaction    ED Discharge Orders    None       Milton Ferguson, MD  11/19/18 1950    Milton Ferguson, MD 11/19/18 2304

## 2018-11-19 NOTE — ED Notes (Signed)
Pt vomited 50 ml of water provided.

## 2018-11-19 NOTE — H&P (Addendum)
Kent Davidson is an 63 y.o. male.   Chief Complaint: Patient is here for esophagogastroduodenoscopy with foreign body removal and possible esophageal dilation. HPI: Patient is 63 year old Caucasian male with history of GERD on pantoprazole who presents with 5-day history of inability to swallow food.  He says it happened on Saturday when he was eating steak.  He would regurgitate liquids.  Since then he has been able to take few sips of soda.  He points to suprasternal area site of bolus obstruction.  He finally decided come to emergency room.  He was seen by Dr. Roderic Palau given IV fluids.  I was contacted for therapeutic EGD. Patient states he had procedure at [facility] but he does not know the details.  He says his esophagus was nondilated.  He has had multiple episodes of food impaction but he has had spontaneous relief.  Feels he has lost few pounds because of his dysphagia.  He denies hematemesis melena or abdominal pain.  He smokes three fourths of a pack of cigarettes per day.  He does not drink alcohol.  In emergency room he was given glucagon but without any benefit. Covid testing is negative.  Past Medical History:  Diagnosis Date  . Gastroesophageal reflux     Past Surgical History:  Procedure Laterality Date  . APPENDECTOMY    . I&D EXTREMITY Right 10/03/2018   Procedure: IRRIGATION AND DEBRIDEMENT  RIGHT ULNA;  Surgeon: Carole Civil, MD;  [facility];  Service: Orthopedics;  Laterality: Right;  . ORIF ULNAR FRACTURE Right 10/03/2018   Procedure: OPEN REDUCTION INTERNAL FIXATION (ORIF) RIGHT ULNAR FRACTURE;  Surgeon: Carole Civil, MD;  [facility];  Service: Orthopedics;  Laterality: Right;  . TENDON REPAIR  11/13/2011   Procedure: TENDON REPAIR;  Surgeon: Tennis Must, MD;  Location: [place];  Service: Orthopedics;  Laterality: Left;  incision and drainage with percutaneous pinning left index finger     History reviewed. No pertinent  family history. Social History:  reports that he has been smoking cigarettes. He has been smoking about 0.50 packs per day. His smokeless tobacco use includes snuff. He reports that he does not drink alcohol or use drugs.  Allergies:  Allergies  Allergen Reactions  . Bee Venom Anaphylaxis    (Not in a hospital admission)   Results for orders placed or performed during the hospital encounter of 11/19/18 (from the past 48 hour(s))  SARS Coronavirus 2 by RT PCR (hospital order, performed in [facility] hospital lab) Nasopharyngeal Nasopharyngeal Swab     Status: None   Collection Time: 11/19/18  4:17 PM   Specimen: Nasopharyngeal Swab  Result Value Ref Range   SARS Coronavirus 2 NEGATIVE NEGATIVE    Comment: (NOTE) If result is NEGATIVE SARS-CoV-2 target nucleic acids are NOT DETECTED. The SARS-CoV-2 RNA is generally detectable in upper and lower  respiratory specimens during the acute phase of infection. The lowest  concentration of SARS-CoV-2 viral copies this assay can detect is 250  copies / mL. A negative result does not preclude SARS-CoV-2 infection  and should not be used as the sole basis for treatment or other  patient management decisions.  A negative result may occur with  improper specimen collection / handling, submission of specimen other  than nasopharyngeal swab, presence of viral mutation(s) within the  areas targeted by this assay, and inadequate number of viral copies  (<250 copies / mL). A negative result must be combined with clinical  observations, patient history, and epidemiological information. If result is POSITIVE SARS-CoV-2 target nucleic acids are DETECTED. The SARS-CoV-2 RNA is generally detectable in upper and lower  respiratory specimens dur ing the acute phase of infection.  Positive  results are indicative of active infection with SARS-CoV-2.  Clinical  correlation with patient history and other diagnostic information is  necessary to determine  patient infection status.  Positive results do  not rule out bacterial infection or co-infection with other viruses. If result is PRESUMPTIVE POSTIVE SARS-CoV-2 nucleic acids MAY BE PRESENT.   A presumptive positive result was obtained on the submitted specimen  and confirmed on repeat testing.  While 2019 novel coronavirus  (SARS-CoV-2) nucleic acids may be present in the submitted sample  additional confirmatory testing may be necessary for epidemiological  and / or clinical management purposes  to differentiate between  SARS-CoV-2 and other Sarbecovirus currently known to infect humans.  If clinically indicated additional testing with an alternate test  methodology [id number]) is advised. The SARS-CoV-2 RNA is generally  detectable in upper and lower respiratory sp ecimens during the acute  phase of infection. The expected result is Negative. Fact Sheet for Patients:  StrictlyIdeas.no Fact Sheet for Healthcare Providers: BankingDealers.co.za This test is not yet approved or cleared by the [place] FDA and has been authorized for detection and/or diagnosis of SARS-CoV-2 by FDA under an Emergency Use Authorization (EUA).  This EUA will remain in effect (meaning this test can be used) for the duration of the COVID-19 declaration under Section 564(b)(1) of the Act, 21 U.S.C. section 360bbb-3(b)(1), unless the authorization is terminated or revoked sooner. Performed at [facility], [address], [place], [place] [address]   CBC with Differential/Platelet     Status: None   Collection Time: 11/19/18  4:38 PM  Result Value Ref Range   WBC 5.6 4.0 - 10.5 K/uL   RBC 4.80 4.22 - 5.81 MIL/uL   Hemoglobin 15.0 13.0 - 17.0 g/dL   HCT 45.5 39.0 - 52.0 %   MCV 94.8 80.0 - 100.0 fL   MCH 31.3 26.0 - 34.0 pg   MCHC 33.0 30.0 - 36.0 g/dL   RDW 13.7 11.5 - 15.5 %   Platelets 290 150 - 400 K/uL   nRBC 0.0 0.0 - 0.2 %   Neutrophils Relative %  65 %   Neutro Abs 3.7 1.7 - 7.7 K/uL   Lymphocytes Relative 24 %   Lymphs Abs 1.4 0.7 - 4.0 K/uL   Monocytes Relative 9 %   Monocytes Absolute 0.5 0.1 - 1.0 K/uL   Eosinophils Relative 1 %   Eosinophils Absolute 0.0 0.0 - 0.5 K/uL   Basophils Relative 1 %   Basophils Absolute 0.0 0.0 - 0.1 K/uL   Immature Granulocytes 0 %   Abs Immature Granulocytes 0.01 0.00 - 0.07 K/uL    Comment: Performed at [facility], [address], [place], [place] [address]  Comprehensive metabolic panel     Status: Abnormal   Collection Time: 11/19/18  4:38 PM  Result Value Ref Range   Sodium 140 135 - 145 mmol/L   Potassium 3.6 3.5 - 5.1 mmol/L   Chloride 105 98 - 111 mmol/L   CO2 24 22 - 32 mmol/L   Glucose, Bld 101 (H) 70 - 99 mg/dL   BUN 23 8 - 23 mg/dL   Creatinine, Ser 1.03 0.61 - 1.24 mg/dL   Calcium 9.2 8.9 - 10.3 mg/dL   Total Protein 7.3 6.5 - 8.1  g/dL   Albumin 4.3 3.5 - 5.0 g/dL   AST 41 15 - 41 U/L   ALT 25 0 - 44 U/L   Alkaline Phosphatase 78 38 - 126 U/L   Total Bilirubin 0.7 0.3 - 1.2 mg/dL   GFR calc non Af Amer >60 >60 mL/min   GFR calc Af Amer >60 >60 mL/min   Anion gap 11 5 - 15    Comment: Performed at [facility], [address], [place], [place] [address]   Dg Chest Portable 1 View  Result Date: 11/19/2018 CLINICAL DATA:  Weakness, difficulty swallowing. EXAM: PORTABLE CHEST 1 VIEW COMPARISON:  10/02/2018 FINDINGS: Cardiomediastinal contours pulmonary vasculature are unremarkable. Lungs are clear. Spinal degenerative change without acute bone finding. Metallic fragments projecting over left axilla unchanged from previous study. IMPRESSION: No signs of acute cardiopulmonary disease. Electronically Signed   By: Zetta Bills M.D.   On: 11/19/2018 17:15    ROS  Blood pressure 122/87, pulse (!) 102, temperature 98.3 F (36.8 C), temperature source Oral, resp. rate 18, height 5\' 11"  (1.803 m), weight 62.6 kg, SpO2 98 %. Physical Exam  Constitutional:  Well-developed thin  Caucasian male in NAD.  HENT:  Mouth/Throat: Oropharynx is clear and moist.  Patient is edentulous.  He does have dentures and he uses them at mealtime.  Eyes: Conjunctivae are normal. No scleral icterus.  Neck: No thyromegaly present.  Cardiovascular: Normal rate and normal heart sounds.  No murmur heard. Irregular rhythm.  Respiratory: Effort normal and breath sounds normal.  GI:  Abdomen is flat soft and nontender with organomegaly or masses.  Musculoskeletal:        General: No edema.  Lymphadenopathy:    He has no cervical adenopathy.  Neurological: He is alert.  Skin: Skin is warm and dry.     Assessment/Plan Foreign body esophagus. Esophagogastroduodenoscopy with foreign body removal and possible esophageal dilation. Rhythm on monitor appears to be in atrial fibrillation.  Patient has no history of A. fib.  Patient will be transferred back to the emergency room once procedure completed.  Hildred Laser, MD 11/19/2018, 9:02 PM

## 2018-11-19 NOTE — Progress Notes (Addendum)
Brief EGD note.  Large foreign body at distal esophagus.  It was impacted.  It was broken into small pieces with polypectomy snare and removed using a Roth net.  At least 12 passes made. Somewhat dilated esophageal body with spastic segment proximal to GE junction concerning for motility disorder question achalasia. The segment was dilated with balloon to 18 mm. Small sliding hiatal hernia. Normal examination the stomach. Erosive bulbar duodenitis. Patchy exudate at distal esophagus secondary to Candida esophagitis.  Patient remained in atrial fibrillation with ventricular rate between 110s and 125.  Recommendations:  Patient needs to be hospitalized for management of new onset of atrial fibrillation. No anticoagulant for 72 hours. Pantoprazole 40 mg IV every 12 hours. Mycostatin suspension 500,000 units swish and swallow 4 times a day. Clear liquids tonight. Esophagogram in a.m.  Discussed with Dr. Roderic Palau of emergency room.

## 2018-11-19 NOTE — ED Triage Notes (Signed)
Patient complaining of difficulty swallowing since Saturday after eating steak. States "when I drink water it just comes right back up. It feels like I might have something stuck in my throat but I don't." States he is unable to keep anything down since Saturday.

## 2018-11-20 ENCOUNTER — Inpatient Hospital Stay (HOSPITAL_COMMUNITY): Payer: No Typology Code available for payment source

## 2018-11-20 ENCOUNTER — Other Ambulatory Visit: Payer: Self-pay

## 2018-11-20 DIAGNOSIS — I4891 Unspecified atrial fibrillation: Principal | ICD-10-CM

## 2018-11-20 DIAGNOSIS — R1310 Dysphagia, unspecified: Secondary | ICD-10-CM

## 2018-11-20 DIAGNOSIS — K222 Esophageal obstruction: Secondary | ICD-10-CM

## 2018-11-20 DIAGNOSIS — I361 Nonrheumatic tricuspid (valve) insufficiency: Secondary | ICD-10-CM | POA: Diagnosis not present

## 2018-11-20 DIAGNOSIS — T18128A Food in esophagus causing other injury, initial encounter: Secondary | ICD-10-CM | POA: Diagnosis not present

## 2018-11-20 DIAGNOSIS — B3781 Candidal esophagitis: Secondary | ICD-10-CM

## 2018-11-20 LAB — COMPREHENSIVE METABOLIC PANEL
ALT: 20 U/L (ref 0–44)
AST: 32 U/L (ref 15–41)
Albumin: 3.4 g/dL — ABNORMAL LOW (ref 3.5–5.0)
Alkaline Phosphatase: 67 U/L (ref 38–126)
Anion gap: 9 (ref 5–15)
BUN: 17 mg/dL (ref 8–23)
CO2: 20 mmol/L — ABNORMAL LOW (ref 22–32)
Calcium: 8.3 mg/dL — ABNORMAL LOW (ref 8.9–10.3)
Chloride: 111 mmol/L (ref 98–111)
Creatinine, Ser: 0.78 mg/dL (ref 0.61–1.24)
GFR calc Af Amer: >60 mL/min (ref >60)
GFR calc non Af Amer: >60 mL/min (ref >60)
Glucose, Bld: 75 mg/dL (ref 70–99)
Potassium: 3.5 mmol/L (ref 3.5–5.1)
Sodium: 140 mmol/L (ref 135–145)
Total Bilirubin: 0.8 mg/dL (ref 0.3–1.2)
Total Protein: 5.7 g/dL — ABNORMAL LOW (ref 6.5–8.1)

## 2018-11-20 LAB — TSH: TSH: 2.715 u[IU]/mL (ref 0.350–4.500)

## 2018-11-20 LAB — CBC
HCT: 43.5 % (ref 39.0–52.0)
Hemoglobin: 14.1 g/dL (ref 13.0–17.0)
MCH: 31.3 pg (ref 26.0–34.0)
MCHC: 32.4 g/dL (ref 30.0–36.0)
MCV: 96.5 fL (ref 80.0–100.0)
Platelets: 266 10*3/uL (ref 150–400)
RBC: 4.51 MIL/uL (ref 4.22–5.81)
RDW: 13.8 % (ref 11.5–15.5)
WBC: 6.5 10*3/uL (ref 4.0–10.5)
nRBC: 0 % (ref 0.0–0.2)

## 2018-11-20 LAB — ECHOCARDIOGRAM COMPLETE
Height: 71 in
Weight: 2127 oz

## 2018-11-20 LAB — MAGNESIUM: Magnesium: 2.1 mg/dL (ref 1.7–2.4)

## 2018-11-20 LAB — HIV ANTIBODY (ROUTINE TESTING W REFLEX): HIV Screen 4th Generation wRfx: NONREACTIVE

## 2018-11-20 MED ORDER — ZOLPIDEM TARTRATE 5 MG PO TABS
5.0000 mg | ORAL_TABLET | Freq: Every evening | ORAL | Status: DC | PRN
  Administered 2018-11-20: 21:00:00 5 mg via ORAL
  Filled 2018-11-20: qty 1

## 2018-11-20 MED ORDER — SODIUM CHLORIDE 0.9 % IV SOLN
INTRAVENOUS | Status: DC
  Administered 2018-11-20: 03:00:00 via INTRAVENOUS

## 2018-11-20 MED ORDER — ACETAMINOPHEN 325 MG PO TABS: 650.0000 mg | ORAL_TABLET | Freq: Four times a day (QID) | ORAL | Status: DC | PRN

## 2018-11-20 MED ORDER — ACETAMINOPHEN 650 MG RE SUPP: 650.0000 mg | Freq: Four times a day (QID) | RECTAL | Status: DC | PRN

## 2018-11-20 MED ORDER — ONDANSETRON HCL 4 MG/2ML IJ SOLN: 4.0000 mg | Freq: Four times a day (QID) | INTRAMUSCULAR | Status: DC | PRN

## 2018-11-20 MED ORDER — NYSTATIN 100000 UNIT/ML MT SUSP
5.0000 mL | Freq: Four times a day (QID) | OROMUCOSAL | Status: DC
  Administered 2018-11-20 – 2018-11-21 (×6): 500000 [IU] via ORAL
  Filled 2018-11-20 (×6): qty 5

## 2018-11-20 MED ORDER — DILTIAZEM HCL 30 MG PO TABS
30.0000 mg | ORAL_TABLET | Freq: Three times a day (TID) | ORAL | Status: DC
  Administered 2018-11-20 – 2018-11-21 (×5): 30 mg via ORAL
  Filled 2018-11-20 (×5): qty 1

## 2018-11-20 MED ORDER — ENOXAPARIN SODIUM 40 MG/0.4ML ~~LOC~~ SOLN
40.0000 mg | SUBCUTANEOUS | Status: DC
  Administered 2018-11-21: 40 mg via SUBCUTANEOUS
  Filled 2018-11-20 (×2): qty 0.4

## 2018-11-20 MED ORDER — PANTOPRAZOLE SODIUM 40 MG IV SOLR
40.0000 mg | Freq: Two times a day (BID) | INTRAVENOUS | Status: DC
  Administered 2018-11-20: 09:00:00 40 mg via INTRAVENOUS
  Filled 2018-11-20: qty 40

## 2018-11-20 MED ORDER — ONDANSETRON HCL 4 MG PO TABS: 4.0000 mg | ORAL_TABLET | Freq: Four times a day (QID) | ORAL | Status: DC | PRN

## 2018-11-20 MED ORDER — NICOTINE 21 MG/24HR TD PT24
21.0000 mg | MEDICATED_PATCH | Freq: Every day | TRANSDERMAL | Status: DC
  Administered 2018-11-20 – 2018-11-21 (×2): 21 mg via TRANSDERMAL
  Filled 2018-11-20 (×2): qty 1

## 2018-11-20 MED ORDER — PANTOPRAZOLE SODIUM 40 MG PO TBEC
40.0000 mg | DELAYED_RELEASE_TABLET | Freq: Two times a day (BID) | ORAL | Status: DC
  Administered 2018-11-20 – 2018-11-21 (×2): 40 mg via ORAL
  Filled 2018-11-20 (×2): qty 1

## 2018-11-20 NOTE — ED Notes (Signed)
ED TO INPATIENT HANDOFF REPORT  ED Nurse Name and Phone #: [phone]  S Name/Age/Gender Kent Davidson 63 y.o. male Room/Bed: APA02/APA02  Code Status   Code Status: Not on file  Home/SNF/Other Home Patient oriented to: situation Is this baseline? Yes   Triage Complete: Triage complete  Chief Complaint Esophageal obstruction due to food impaction [K22.2, [id number]  Triage Note Patient complaining of difficulty swallowing since Saturday after eating steak. States "when I drink water it just comes right back up. It feels like I might have something stuck in my throat but I don't." States he is unable to keep anything down since Saturday.   Allergies Allergies  Allergen Reactions  . Bee Venom Anaphylaxis    Level of Care/Admitting Diagnosis ED Disposition    ED Disposition Condition [facility] Area: [facility] [id number]  Level of Care: Telemetry [5]  Covid Evaluation: Confirmed COVID Negative  Diagnosis: Atrial fibrillation ([place]) [427.31.ICD-9-CM]  Admitting Physician: Loree Fee  Attending Physician: Oswald Hillock [[id number]]  Estimated length of stay: 3 - 4 days  Certification:: I certify this patient will need inpatient services for at least 2 midnights  PT Class (Do Not Modify): Inpatient [101]  PT Acc Code (Do Not Modify): Private [1]       B Medical/Surgery History Past Medical History:  Diagnosis Date  . Gastroesophageal reflux    Past Surgical History:  Procedure Laterality Date  . APPENDECTOMY    . I&D EXTREMITY Right 10/03/2018   Procedure: IRRIGATION AND DEBRIDEMENT  RIGHT ULNA;  Surgeon: Carole Civil, MD;  Location: [facility];  Service: Orthopedics;  Laterality: Right;  . ORIF ULNAR FRACTURE Right 10/03/2018   Procedure: OPEN REDUCTION INTERNAL FIXATION (ORIF) RIGHT ULNAR FRACTURE;  Surgeon: Carole Civil, MD;  Location: [facility];  Service: Orthopedics;  Laterality: Right;  . TENDON REPAIR  11/13/2011   Procedure: TENDON REPAIR;  Surgeon: Tennis Must, MD;  Location: [place];  Service: Orthopedics;  Laterality: Left;  incision and drainage with percutaneous pinning left index finger      A IV Location/Drains/Wounds Patient Lines/Drains/Airways Status   Active Line/Drains/Airways    Name:   Placement date:   Placement time:   Site:   Days:   Peripheral IV 11/19/18 Left;Lateral Antecubital   11/19/18    1714    Antecubital   1   Incision (Closed) 10/03/18 Arm Right   10/03/18    1220     48          Intake/Output Last 24 hours  Intake/Output Summary (Last 24 hours) at 11/20/2018 0137 Last data filed at 11/19/2018 1854 Gross per 24 hour  Intake 1000 ml  Output -  Net 1000 ml    Labs/Imaging Results for orders placed or performed during the hospital encounter of 11/19/18 (from the past 48 hour(s))  SARS Coronavirus 2 by RT PCR (hospital order, performed in [place] hospital lab) Nasopharyngeal Nasopharyngeal Swab     Status: None   Collection Time: 11/19/18  4:17 PM   Specimen: Nasopharyngeal Swab  Result Value Ref Range   SARS Coronavirus 2 NEGATIVE NEGATIVE    Comment: (NOTE) If result is NEGATIVE SARS-CoV-2 target nucleic acids are NOT DETECTED. The SARS-CoV-2 RNA is generally detectable in upper and lower  respiratory specimens during the acute phase of infection. The lowest  concentration of SARS-CoV-2 viral copies this assay can detect is 250  copies / mL. A negative  result does not preclude SARS-CoV-2 infection  and should not be used as the sole basis for treatment or other  patient management decisions.  A negative result may occur with  improper specimen collection / handling, submission of specimen other  than nasopharyngeal swab, presence of viral mutation(s) within the  areas targeted by this assay, and inadequate number of viral copies  (<250 copies / mL). A negative result must be combined with clinical  observations, patient history, and  epidemiological information. If result is POSITIVE SARS-CoV-2 target nucleic acids are DETECTED. The SARS-CoV-2 RNA is generally detectable in upper and lower  respiratory specimens dur ing the acute phase of infection.  Positive  results are indicative of active infection with SARS-CoV-2.  Clinical  correlation with patient history and other diagnostic information is  necessary to determine patient infection status.  Positive results do  not rule out bacterial infection or co-infection with other viruses. If result is PRESUMPTIVE POSTIVE SARS-CoV-2 nucleic acids MAY BE PRESENT.   A presumptive positive result was obtained on the submitted specimen  and confirmed on repeat testing.  While 2019 novel coronavirus  (SARS-CoV-2) nucleic acids may be present in the submitted sample  additional confirmatory testing may be necessary for epidemiological  and / or clinical management purposes  to differentiate between  SARS-CoV-2 and other Sarbecovirus currently known to infect humans.  If clinically indicated additional testing with an alternate test  methodology [phone]) is advised. The SARS-CoV-2 RNA is generally  detectable in upper and lower respiratory sp ecimens during the acute  phase of infection. The expected result is Negative. Fact Sheet for Patients:  StrictlyIdeas.no Fact Sheet for Healthcare Providers: BankingDealers.co.za This test is not yet approved or cleared by the [place] FDA and has been authorized for detection and/or diagnosis of SARS-CoV-2 by FDA under an Emergency Use Authorization (EUA).  This EUA will remain in effect (meaning this test can be used) for the duration of the COVID-19 declaration under Section 564(b)(1) of the Act, 21 U.S.C. section 360bbb-3(b)(1), unless the authorization is terminated or revoked sooner. Performed at [facility], [address] [place], [place] [address]   CBC with  Differential/Platelet     Status: None   Collection Time: 11/19/18  4:38 PM  Result Value Ref Range   WBC 5.6 4.0 - 10.5 K/uL   RBC 4.80 4.22 - 5.81 MIL/uL   Hemoglobin 15.0 13.0 - 17.0 g/dL   HCT 45.5 39.0 - 52.0 %   MCV 94.8 80.0 - 100.0 fL   MCH 31.3 26.0 - 34.0 pg   MCHC 33.0 30.0 - 36.0 g/dL   RDW 13.7 11.5 - 15.5 %   Platelets 290 150 - 400 K/uL   nRBC 0.0 0.0 - 0.2 %   Neutrophils Relative % 65 %   Neutro Abs 3.7 1.7 - 7.7 K/uL   Lymphocytes Relative 24 %   Lymphs Abs 1.4 0.7 - 4.0 K/uL   Monocytes Relative 9 %   Monocytes Absolute 0.5 0.1 - 1.0 K/uL   Eosinophils Relative 1 %   Eosinophils Absolute 0.0 0.0 - 0.5 K/uL   Basophils Relative 1 %   Basophils Absolute 0.0 0.0 - 0.1 K/uL   Immature Granulocytes 0 %   Abs Immature Granulocytes 0.01 0.00 - 0.07 K/uL    Comment: Performed at [facility], [address], [place], [place] [address]  Comprehensive metabolic panel     Status: Abnormal   Collection Time: 11/19/18  4:38 PM  Result Value  Ref Range   Sodium 140 135 - 145 mmol/L   Potassium 3.6 3.5 - 5.1 mmol/L   Chloride 105 98 - 111 mmol/L   CO2 24 22 - 32 mmol/L   Glucose, Bld 101 (H) 70 - 99 mg/dL   BUN 23 8 - 23 mg/dL   Creatinine, Ser 1.03 0.61 - 1.24 mg/dL   Calcium 9.2 8.9 - 10.3 mg/dL   Total Protein 7.3 6.5 - 8.1 g/dL   Albumin 4.3 3.5 - 5.0 g/dL   AST 41 15 - 41 U/L   ALT 25 0 - 44 U/L   Alkaline Phosphatase 78 38 - 126 U/L   Total Bilirubin 0.7 0.3 - 1.2 mg/dL   GFR calc non Af Amer >60 >60 mL/min   GFR calc Af Amer >60 >60 mL/min   Anion gap 11 5 - 15    Comment: Performed at [facility], [address], [place], [place] [address]   Dg Chest Portable 1 View  Result Date: 11/19/2018 CLINICAL DATA:  Weakness, difficulty swallowing. EXAM: PORTABLE CHEST 1 VIEW COMPARISON:  10/02/2018 FINDINGS: Cardiomediastinal contours pulmonary vasculature are unremarkable. Lungs are clear. Spinal degenerative change without acute bone finding. Metallic fragments  projecting over left axilla unchanged from previous study. IMPRESSION: No signs of acute cardiopulmonary disease. Electronically Signed   By: Zetta Bills M.D.   On: 11/19/2018 17:15    Pending Labs [employer] (From admission, onward)    Start     Ordered   Signed and Held  HIV Antibody (routine testing w rflx)  (HIV Antibody (Routine testing w reflex) panel)  Once,   R     Signed and Held   Signed and Held  HIV4GL Save Tube  (HIV Antibody (Routine testing w reflex) panel)  Once,   R     Signed and Held   Signed and Held  CBC  (enoxaparin (LOVENOX)    CrCl >/= 30 ml/min)  Once,   R    Comments: Baseline for enoxaparin therapy IF NOT ALREADY DRAWN.  Notify MD if PLT < 100 K.    Signed and Held   Signed and Held  Creatinine, serum  (enoxaparin (LOVENOX)    CrCl >/= 30 ml/min)  Once,   R    Comments: Baseline for enoxaparin therapy IF NOT ALREADY DRAWN.    Signed and Held   Signed and Held  Creatinine, serum  (enoxaparin (LOVENOX)    CrCl >/= 30 ml/min)  Weekly,   R    Comments: while on enoxaparin therapy    Signed and Held   Signed and Held  CBC  Tomorrow morning,   R     Signed and Held   Signed and Held  Comprehensive metabolic panel  Tomorrow morning,   R     Signed and Held          Vitals/Pain Today's Vitals   11/19/18 2315 11/19/18 2317 11/20/18 0000 11/20/18 0030  BP:   104/80 107/80  Pulse: 93  99 93  Resp: (!) 29  11 (!) 31  Temp:      TempSrc:      SpO2: 96%  97% 94%  Weight:      Height:      PainSc:  0-No pain      Isolation Precautions No active isolations  Medications Medications  midazolam (VERSED) 5 MG/5ML injection (has no administration in time range)  meperidine (DEMEROL) 50 MG/ML injection (has no administration in time range)  lidocaine (  XYLOCAINE) 2 % viscous mouth solution (has no administration in time range)  sodium chloride 0.9 % bolus 1,000 mL (0 mLs Intravenous Stopped 11/19/18 1854)  glucagon (human recombinant) (GLUCAGEN)  injection 1 mg (1 mg Intravenous Given 11/19/18 1716)    Mobility walks Low fall risk   Focused Assessments    R Recommendations: See Admitting Provider Note  Report given to:   Additional Notes:

## 2018-11-20 NOTE — Plan of Care (Signed)
  Problem: Education: Goal: Knowledge of disease or condition will improve Outcome: Progressing Goal: Understanding of medication regimen will improve Outcome: Progressing   Problem: Health Behavior/Discharge Planning: Goal: Ability to manage health-related needs will improve Outcome: Progressing   Problem: Education: Goal: Knowledge of General Education information will improve Description: Including pain rating scale, medication(s)/side effects and non-pharmacologic comfort measures Outcome: Progressing   Problem: Education: Goal: Knowledge of disease or condition will improve Outcome: Progressing Goal: Understanding of medication regimen will improve Outcome: Progressing

## 2018-11-20 NOTE — Progress Notes (Signed)
*  PRELIMINARY RESULTS* Echocardiogram 2D Echocardiogram has been performed.  Leavy Cella 11/20/2018, 3:03 PM

## 2018-11-20 NOTE — Progress Notes (Signed)
Patient seen and examined.  Admitted after midnight secondary to A. fib with RVR after endoscopy procedure.  Findings on endoscopic procedure demonstrated stop foreign body and concern for achalasia, mild esophageal candidiasis.  Patient hemodynamically stable and after initiation of oral Cardizem good control of blood pressure, heart rate and breathing.  Patient's CHADSVASC score 1, will be ok to discharge on ASA for secondary prevention.   Plan: -Follow esophagogram results -continue nystatin  -continue PPI -follow GI recommendations regarding diet advancement -hopefully discharge tomorrow.   Barton Dubois MD [phone]

## 2018-11-20 NOTE — Plan of Care (Signed)
°  Problem: Education: °Goal: Knowledge of disease or condition will improve °Outcome: Progressing °Goal: Understanding of medication regimen will improve °Outcome: Progressing °  °

## 2018-11-20 NOTE — Progress Notes (Signed)
  Subjective:  Patient reports no difficulty with liquids.  He is hungry. He has not had alcohol yet.  Objective: Blood pressure 101/79, pulse 72, temperature 98.5 F (36.9 C), temperature source Oral, resp. rate 20, height '5\' 11"'$  (1.803 m), weight 60.3 kg, SpO2 90 %. Patient is alert and in no acute distress.  Labs/studies Results:  CBC Latest Ref Rng & Units 11/20/2018 11/19/2018 10/02/2018  WBC 4.0 - 10.5 K/uL 6.5 5.6 10.2  Hemoglobin 13.0 - 17.0 g/dL 14.1 15.0 14.0  Hematocrit 39.0 - 52.0 % 43.5 45.5 42.9  Platelets 150 - 400 K/uL 266 290 162    CMP Latest Ref Rng & Units 11/20/2018 11/19/2018 10/02/2018  Glucose 70 - 99 mg/dL 75 101(H) 115(H)  BUN 8 - 23 mg/dL '17 23 21  '$ Creatinine 0.61 - 1.24 mg/dL 0.78 1.03 0.96  Sodium 135 - 145 mmol/L 140 140 142  Potassium 3.5 - 5.1 mmol/L 3.5 3.6 4.4  Chloride 98 - 111 mmol/L 111 105 109  CO2 22 - 32 mmol/L 20(L) 24 25  Calcium 8.9 - 10.3 mg/dL 8.3(L) 9.2 9.4  Total Protein 6.5 - 8.1 g/dL 5.7(L) 7.3 6.6  Total Bilirubin 0.3 - 1.2 mg/dL 0.8 0.7 0.6  Alkaline Phos 38 - 126 U/L 67 78 56  AST 15 - 41 U/L 32 41 28  ALT 0 - 44 U/L '20 25 25    '$ Hepatic Function Latest Ref Rng & Units 11/20/2018 11/19/2018 10/02/2018  Total Protein 6.5 - 8.1 g/dL 5.7(L) 7.3 6.6  Albumin 3.5 - 5.0 g/dL 3.4(L) 4.3 3.9  AST 15 - 41 U/L 32 41 28  ALT 0 - 44 U/L '20 25 25  '$ Alk Phosphatase 38 - 126 U/L 67 78 56  Total Bilirubin 0.3 - 1.2 mg/dL 0.8 0.7 0.6    Barium study reviewed with Dr. Rolm Baptise.  Patient has spasm to mid and distal esophagus with focal dilation distally proximal to narrow segment. Images from the study and endoscopic examination shared with patient.  Assessment:  #1.  Esophageal dysphagia.  Patient underwent EGD with foreign body removal and esophageal dilation yesterday.  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  Esophagogram confirms this possibility.  It will be further evaluated with high-resolution esophageal manometry.   Elective EGD with dilation would be recommended when inflammation has subsided. Patient is on calcium channel blocker which might help with esophageal spasm.  I notice patient is on anticoagulant.  Ideally would have like to wait 72 hours.  #2.  Mild Candida esophagitis.  #3.  Erosive bulbar duodenitis.  H. pylori serology pending  Recommendations  Diet advanced to mechanical soft diet.  Patient advised not to eat steak.  If he wants to eat meat he may consider ground beef.  He must chew food thoroughly and take small bites. PPI changed to oral route. Patient stable for discharge from GI standpoint. Will arrange for EGD with possible dilation in 4 weeks before scheduling esophageal manometry. He should continue Mycostatin suspension for total of 10 days. Consider holding anticoagulant for 2 more days.

## 2018-11-20 NOTE — H&P (Addendum)
TRH H&P    Patient Demographics:    Kent Davidson, is a 63 y.o. male  MRN: [id number]  DOB - 1955-04-19  Admit Date - 11/19/2018  Referring MD/NP/PA: Dr. Roderic Palau  Outpatient Primary MD for the patient is System, Pcp Not In  Patient coming from: Home  Chief complaint-difficulty swallowing   HPI:    Kent Davidson  is a 63 y.o. male, with history of GERD came to ED with complaints of difficulty swallowing.  Patient stated that he was eating steak on Saturday and after that has not been able to eat or drink anything.  He feels something stuck in his throat.  GI was consulted.  Patient underwent EGD, which showed large impacted foreign body at distal esophagus.  It was broken into small pieces with polypectomy snare and removed using a Roth net.  Esophagus was related with spastic segment proximal to the GE junction concerning for motility disorder, question achalasia.  Segment was dilated with balloon to 18 mm.  Also noted to have patchy exudate at distal esophagus secondary to Candida esophagitis. Patient also found to have new onset A. Fib. He denies chest pain. Denies nausea or vomiting. Denies shortness of breath. No previous history of stroke or seizures.   Review of systems:    In addition to the HPI above,   All other systems reviewed and are negative.    Past History of the following :    Past Medical History:  Diagnosis Date  . Gastroesophageal reflux       Past Surgical History:  Procedure Laterality Date  . APPENDECTOMY    . I&D EXTREMITY Right 10/03/2018   Procedure: IRRIGATION AND DEBRIDEMENT  RIGHT ULNA;  Surgeon: Carole Civil, MD;  Location: [facility];  Service: Orthopedics;  Laterality: Right;  . ORIF ULNAR FRACTURE Right 10/03/2018   Procedure: OPEN REDUCTION INTERNAL FIXATION (ORIF) RIGHT ULNAR FRACTURE;  Surgeon: Carole Civil, MD;  Location: [facility];  Service:  Orthopedics;  Laterality: Right;  . TENDON REPAIR  11/13/2011   Procedure: TENDON REPAIR;  Surgeon: Tennis Must, MD;  Location: [facility];  Service: Orthopedics;  Laterality: Left;  incision and drainage with percutaneous pinning left index finger       Social History:      Social History   Tobacco Use  . Smoking status: Current Every Day Smoker    Packs/day: 0.50    Types: Cigarettes  . Smokeless tobacco: Current User    Types: Snuff  Substance Use Topics  . Alcohol use: No       Family History :   No family history of heart disease or cancer   Home Medications:   Prior to Admission medications   Medication Sig Start Date End Date Taking? Authorizing Provider  pantoprazole (PROTONIX) 20 MG tablet Take 20 mg by mouth daily.   Yes [provider]     Allergies:     Allergies  Allergen Reactions  . Bee Venom Anaphylaxis     Physical Exam:   Vitals  Blood pressure 107/80, pulse 93, temperature (!) 97 F (36.1 C), temperature source Oral, resp. rate (!) 31, height 5\' 11"  (1.803 m), weight 62.6 kg, SpO2 94 %.  1.  General: Appears in no acute distress  2. Psychiatric: Alert, oriented x3, intact insight and judgment  3. Neurologic: Cranial nerves II through XII grossly intact, motor strength 5/5 in all extremities  4. HEENMT:  Atraumatic normocephalic, extraocular muscles intact  5. Respiratory : Clear to auscultation bilaterally, no wheezing or crackles  6. Cardiovascular : S1-S2, regular, no murmur auscultated  7. Gastrointestinal:  Abdomen is soft, nontender, no organomegaly  8. Skin:  No rashes noted      Data Review:    CBC Recent Labs  Lab 11/19/18 1638  WBC 5.6  HGB 15.0  HCT 45.5  PLT 290  MCV 94.8  MCH 31.3  MCHC 33.0  RDW 13.7  LYMPHSABS 1.4  MONOABS 0.5  EOSABS 0.0  BASOSABS 0.0    ------------------------------------------------------------------------------------------------------------------  Results for orders placed or performed during the hospital encounter of 11/19/18 (from the past 48 hour(s))  SARS Coronavirus 2 by RT PCR (hospital order, performed in [facility] hospital lab) Nasopharyngeal Nasopharyngeal Swab     Status: None   Collection Time: 11/19/18  4:17 PM   Specimen: Nasopharyngeal Swab  Result Value Ref Range   SARS Coronavirus 2 NEGATIVE NEGATIVE    Comment: (NOTE) If result is NEGATIVE SARS-CoV-2 target nucleic acids are NOT DETECTED. The SARS-CoV-2 RNA is generally detectable in upper and lower  respiratory specimens during the acute phase of infection. The lowest  concentration of SARS-CoV-2 viral copies this assay can detect is 250  copies / mL. A negative result does not preclude SARS-CoV-2 infection  and should not be used as the sole basis for treatment or other  patient management decisions.  A negative result may occur with  improper specimen collection / handling, submission of specimen other  than nasopharyngeal swab, presence of viral mutation(s) within the  areas targeted by this assay, and inadequate number of viral copies  (<250 copies / mL). A negative result must be combined with clinical  observations, patient history, and epidemiological information. If result is POSITIVE SARS-CoV-2 target nucleic acids are DETECTED. The SARS-CoV-2 RNA is generally detectable in upper and lower  respiratory specimens dur ing the acute phase of infection.  Positive  results are indicative of active infection with SARS-CoV-2.  Clinical  correlation with patient history and other diagnostic information is  necessary to determine patient infection status.  Positive results do  not rule out bacterial infection or co-infection with other viruses. If result is PRESUMPTIVE POSTIVE SARS-CoV-2 nucleic acids MAY BE PRESENT.   A presumptive positive  result was obtained on the submitted specimen  and confirmed on repeat testing.  While 2019 novel coronavirus  (SARS-CoV-2) nucleic acids may be present in the submitted sample  additional confirmatory testing may be necessary for epidemiological  and / or clinical management purposes  to differentiate between  SARS-CoV-2 and other Sarbecovirus currently known to infect humans.  If clinically indicated additional testing with an alternate test  methodology [phone]) is advised. The SARS-CoV-2 RNA is generally  detectable in upper and lower respiratory sp ecimens during the acute  phase of infection. The expected result is Negative. Fact Sheet for Patients:  StrictlyIdeas.no Fact Sheet for Healthcare Providers: BankingDealers.co.za This test is not yet approved or cleared by the [place] FDA and has been authorized for detection and/or diagnosis of SARS-CoV-2  by FDA under an Emergency Use Authorization (EUA).  This EUA will remain in effect (meaning this test can be used) for the duration of the COVID-19 declaration under Section 564(b)(1) of the Act, 21 U.S.C. section 360bbb-3(b)(1), unless the authorization is terminated or revoked sooner. Performed at [facility], [address], [place], [place] [address]   CBC with Differential/Platelet     Status: None   Collection Time: 11/19/18  4:38 PM  Result Value Ref Range   WBC 5.6 4.0 - 10.5 K/uL   RBC 4.80 4.22 - 5.81 MIL/uL   Hemoglobin 15.0 13.0 - 17.0 g/dL   HCT 45.5 39.0 - 52.0 %   MCV 94.8 80.0 - 100.0 fL   MCH 31.3 26.0 - 34.0 pg   MCHC 33.0 30.0 - 36.0 g/dL   RDW 13.7 11.5 - 15.5 %   Platelets 290 150 - 400 K/uL   nRBC 0.0 0.0 - 0.2 %   Neutrophils Relative % 65 %   Neutro Abs 3.7 1.7 - 7.7 K/uL   Lymphocytes Relative 24 %   Lymphs Abs 1.4 0.7 - 4.0 K/uL   Monocytes Relative 9 %   Monocytes Absolute 0.5 0.1 - 1.0 K/uL   Eosinophils Relative 1 %   Eosinophils Absolute  0.0 0.0 - 0.5 K/uL   Basophils Relative 1 %   Basophils Absolute 0.0 0.0 - 0.1 K/uL   Immature Granulocytes 0 %   Abs Immature Granulocytes 0.01 0.00 - 0.07 K/uL    Comment: Performed at [facility], [address], [place], [place] [address]  Comprehensive metabolic panel     Status: Abnormal   Collection Time: 11/19/18  4:38 PM  Result Value Ref Range   Sodium 140 135 - 145 mmol/L   Potassium 3.6 3.5 - 5.1 mmol/L   Chloride 105 98 - 111 mmol/L   CO2 24 22 - 32 mmol/L   Glucose, Bld 101 (H) 70 - 99 mg/dL   BUN 23 8 - 23 mg/dL   Creatinine, Ser 1.03 0.61 - 1.24 mg/dL   Calcium 9.2 8.9 - 10.3 mg/dL   Total Protein 7.3 6.5 - 8.1 g/dL   Albumin 4.3 3.5 - 5.0 g/dL   AST 41 15 - 41 U/L   ALT 25 0 - 44 U/L   Alkaline Phosphatase 78 38 - 126 U/L   Total Bilirubin 0.7 0.3 - 1.2 mg/dL   GFR calc non Af Amer >60 >60 mL/min   GFR calc Af Amer >60 >60 mL/min   Anion gap 11 5 - 15    Comment: Performed at [facility], [address], [place], [place] [address]    Chemistries  Recent Labs  Lab 11/19/18 1638  NA 140  K 3.6  CL 105  CO2 24  GLUCOSE 101*  BUN 23  CREATININE 1.03  CALCIUM 9.2  AST 41  ALT 25  ALKPHOS 78  BILITOT 0.7   ------------------------------------------------------------------------------------------------------------------  ------------------------------------------------------------------------------------------------------------------ GFR: Estimated Creatinine Clearance: 65 mL/min (by C-G formula based on SCr of 1.03 mg/dL). Liver Function Tests: Recent Labs  Lab 11/19/18 1638  AST 41  ALT 25  ALKPHOS 78  BILITOT 0.7  PROT 7.3  ALBUMIN 4.3    --------------------------------------------------------------------------------------------------------------- Urine analysis:    Component Value Date/Time   COLORURINE AMBER (A) 07/27/2017 1220   APPEARANCEUR HAZY (A) 07/27/2017 1220   LABSPEC 1.039 (H) 07/27/2017 1220   PHURINE 5.0 07/27/2017 1220    GLUCOSEU NEGATIVE 07/27/2017 1220   [place] 07/27/2017 1220  BILIRUBINUR NEGATIVE 07/27/2017 1220   KETONESUR 5 (A) 07/27/2017 1220   PROTEINUR 30 (A) 07/27/2017 1220   NITRITE NEGATIVE 07/27/2017 1220   LEUKOCYTESUR NEGATIVE 07/27/2017 1220      Imaging Results:    Dg Chest Portable 1 View  Result Date: 11/19/2018 CLINICAL DATA:  Weakness, difficulty swallowing. EXAM: PORTABLE CHEST 1 VIEW COMPARISON:  10/02/2018 FINDINGS: Cardiomediastinal contours pulmonary vasculature are unremarkable. Lungs are clear. Spinal degenerative change without acute bone finding. Metallic fragments projecting over left axilla unchanged from previous study. IMPRESSION: No signs of acute cardiopulmonary disease. Electronically Signed   By: Zetta Bills M.D.   On: 11/19/2018 17:15    My personal review of EKG: Rhythm atrial fibrillation   Assessment & Plan:    Active Problems:   Atrial fibrillation ([place])   1. New onset atrial fibrillation-patient has new onset atrial fibrillation, heart rate is controlled.  Will start Cardizem 30 mg p.o. every 8 hours. CHA2DS2VASc score is 1.  Will obtain echocardiogram in a.m. No anticoagulation as per GI for 72 hours.  2. Dysphagia-patient had foreign body removed from the esophagus, concern for achalasia.  GI recommends esophagram in a.m.  Clear liquid diet.  Protonix 40 mg IV every 12 hours.  3. Hypertension-blood pressure has been elevated, started on Cardizem as above.  We will continue to monitor.  Patient was not taking any medications at home.    DVT Prophylaxis-   Lovenox   AM Labs Ordered, also please review Full Orders  Family Communication: Admission, patients condition and plan of care including tests being ordered have been discussed with the patient who indicate understanding and agree with the plan and Code Status.  Code Status: Full code  Admission status: Inpatient: Based on patients clinical presentation and evaluation of above  clinical data, I have made determination that patient meets Inpatient criteria at this time.  Time spent in minutes : 60 minutes   Oswald Hillock M.D on 11/20/2018 at 1:03 AM

## 2018-11-21 DIAGNOSIS — I5020 Unspecified systolic (congestive) heart failure: Secondary | ICD-10-CM

## 2018-11-21 DIAGNOSIS — B3781 Candidal esophagitis: Secondary | ICD-10-CM | POA: Diagnosis not present

## 2018-11-21 DIAGNOSIS — I4891 Unspecified atrial fibrillation: Secondary | ICD-10-CM | POA: Diagnosis not present

## 2018-11-21 DIAGNOSIS — K2100 Gastro-esophageal reflux disease with esophagitis, without bleeding: Secondary | ICD-10-CM | POA: Diagnosis not present

## 2018-11-21 DIAGNOSIS — T18128A Food in esophagus causing other injury, initial encounter: Secondary | ICD-10-CM

## 2018-11-21 DIAGNOSIS — K222 Esophageal obstruction: Secondary | ICD-10-CM | POA: Diagnosis not present

## 2018-11-21 DIAGNOSIS — I10 Essential (primary) hypertension: Secondary | ICD-10-CM

## 2018-11-21 LAB — CBC
HCT: 43.8 % (ref 39.0–52.0)
Hemoglobin: 14.3 g/dL (ref 13.0–17.0)
MCH: 31 pg (ref 26.0–34.0)
MCHC: 32.6 g/dL (ref 30.0–36.0)
MCV: 95 fL (ref 80.0–100.0)
Platelets: 253 10*3/uL (ref 150–400)
RBC: 4.61 MIL/uL (ref 4.22–5.81)
RDW: 13.6 % (ref 11.5–15.5)
WBC: 5.2 10*3/uL (ref 4.0–10.5)
nRBC: 0 % (ref 0.0–0.2)

## 2018-11-21 LAB — BASIC METABOLIC PANEL
Anion gap: 5 (ref 5–15)
BUN: 12 mg/dL (ref 8–23)
CO2: 22 mmol/L (ref 22–32)
Calcium: 7.9 mg/dL — ABNORMAL LOW (ref 8.9–10.3)
Chloride: 111 mmol/L (ref 98–111)
Creatinine, Ser: 0.86 mg/dL (ref 0.61–1.24)
GFR calc Af Amer: >60 mL/min (ref >60)
GFR calc non Af Amer: >60 mL/min (ref >60)
Glucose, Bld: 90 mg/dL (ref 70–99)
Potassium: 3.5 mmol/L (ref 3.5–5.1)
Sodium: 138 mmol/L (ref 135–145)

## 2018-11-21 LAB — H. PYLORI ANTIBODY, IGG: H Pylori IgG: 0.7 Index Value (ref 0.00–0.79)

## 2018-11-21 MED ORDER — NICOTINE 21 MG/24HR TD PT24: 21.0000 mg | MEDICATED_PATCH | Freq: Every day | TRANSDERMAL | 1 refills | Status: DC

## 2018-11-21 MED ORDER — NYSTATIN 100000 UNIT/ML MT SUSP: 5.0000 mL | Freq: Four times a day (QID) | OROMUCOSAL | 0 refills | Status: AC

## 2018-11-21 MED ORDER — DILTIAZEM HCL ER COATED BEADS 240 MG PO CP24: 240.0000 mg | ORAL_CAPSULE | Freq: Every day | ORAL | 3 refills | Status: DC

## 2018-11-21 MED ORDER — PANTOPRAZOLE SODIUM 40 MG PO TBEC: 40.0000 mg | DELAYED_RELEASE_TABLET | Freq: Two times a day (BID) | ORAL | 1 refills | Status: DC

## 2018-11-21 MED ORDER — ASPIRIN EC 325 MG PO TBEC: 325.0000 mg | DELAYED_RELEASE_TABLET | Freq: Every day | ORAL | 3 refills | Status: DC

## 2018-11-21 NOTE — Discharge Summary (Signed)
Physician Discharge Summary  Kent Davidson [id number] DOB: [birth year] DOA: 11/19/2018  PCP: System, Pcp Not In  Admit date: 11/19/2018 Discharge date: 11/21/2018  Time spent: 35 minutes  Recommendations for Outpatient Follow-up:  1. Repeat basic metabolic panel to follow electrolytes and renal function 2. Reassess blood pressure/heart rate and further adjust medications as needed 3. Outpatient follow-up with cardiology service to further discuss need of long-term anticoagulation and further adjustment in the treatment of atrial fibrillation. 4. -Continue assisting with tobacco abuse cessation.   Discharge Diagnoses:  Active Problems:   Atrial fibrillation (HCC)   Esophageal obstruction due to food impaction   Gastroesophageal reflux disease with esophagitis without hemorrhage   Esophageal candidiasis (HCC)   Essential hypertension Mild acute systolic heart failure Tobacco abuse  Discharge Condition: Stable and improved.  Patient discharged home with instruction to follow-up with PCP and cardiology service as an outpatient.  Diet recommendation: Heart healthy/low-sodium diet.  Filed Weights   11/19/18 1552 11/20/18 0200  Weight: 62.6 kg 60.3 kg    History of present illness:  63 y.o. male, with history of GERD came to ED with complaints of difficulty swallowing.  Patient stated that he was eating steak on Saturday and after that has not been able to eat or drink anything.  He feels something stuck in his throat.  GI was consulted.  Patient underwent EGD, which showed large impacted foreign body at distal esophagus.  It was broken into small pieces with polypectomy snare and removed using a Roth net.  Esophagus was related with spastic segment proximal to the GE junction concerning for motility disorder, question achalasia.  Segment was dilated with balloon to 18 mm.  Also noted to have patchy exudate at distal esophagus secondary to Candida esophagitis. Patient also  found to have new onset A. Fib. He denies chest pain. Denies nausea or vomiting. Denies shortness of breath. No previous history of stroke or seizures  Hospital Course:  1-new onset atrial fibrillation -Appreciated after completion of endoscopy proceeded -No chest pain -CHADsVASC score 2 (after echo reviewed) -not looking to commit to long term anticoagulation currently -excellent response to cardizem and with rate controlled at discharge -Patient hemodynamically stable denying palpitations currently. -Case discussed with cardiology service who has recommended outpatient follow-up to further determine the need of long-term anticoagulation, potential cardioversion and further management. -Patient [facility] and has been instructed to pursued follow-up through the pathway or been actively refer with different cardiology service. -back up plan with outpatient visit with Dr. Bronson Ing requested.  2-mild systolic HF -No ACE inhibitors or beta-blocker in the setting of supple pressure -Patient using Cardizem for rate control of new onset A. fib. -Close follow-up with outpatient cardiology service recommended. -Discussion about heart healthy diet/low-sodium and adequate hydration sustained with patient. -Daily weight has been encouraged.  3-essential hypertension -Not taking medication prior to admission -Stable blood pressure/mildly so without associated orthostatic changes at time of discharge. -Continue the use of Cardizem for now. -Heart healthy diet encouraged.  4-Dysphagia/food impaction/Candida esophagitis -Status post endoscopy and disimpaction. -Discharged on Protonix twice a day and nystatin as recommended by GI service -Soft diet and a small bites recommended.  5-esophageal spasm -will improved with the use of CC blockers -continue PPI  6-tobacco abuse -Cessation counseling has been recommended -Continue nicotine patch at discharge.   Procedures:  2-D  echo:  1. Left ventricular ejection fraction, by visual estimation, is 45%. The left ventricle has mildly decreased function. There is mildly increased  left ventricular hypertrophy. There is global hypokinesis.  2. Left ventricular diastolic Doppler parameters are indeterminate in the setting of atrial fibrillation.  3. Global right ventricle has normal systolic function.The right ventricular size is normal. No increase in right ventricular wall thickness.  4. Left atrial size was mildly dilated.  5. Right atrial size was mildly dilated.  6. The mitral valve is grossly normal. Trace mitral valve regurgitation.  7. The tricuspid valve is grossly normal. Tricuspid valve regurgitation is mild.  8. The aortic valve is tricuspid Aortic valve regurgitation is trivial by color flow Doppler.  9. The pulmonic valve was grossly normal. Pulmonic valve regurgitation is trivial by color flow Doppler. 10. Aortic dilatation noted. 11. There is mild dilatation of the aortic root and of the ascending aorta. 12. Normal pulmonary artery systolic pressure. 13. The inferior vena cava is normal in size with greater than 50% respiratory variability, suggesting right atrial pressure of 3 mmHg. 14. The tricuspid regurgitant velocity is 1.95 m/s, and with an assumed right atrial pressure of 10 mmHg, the estimated right ventricular systolic pressure is normal at 25.2 mmHg.  Consultations:  Gastroenterology service  Cardiology curbside: Recommended to discharge on Cardizem and aspirin; outpatient follow-up  Discharge Exam: Vitals:   11/20/18 2125 11/21/18 0553  BP: 105/73 114/86  Pulse: 83 81  Resp:  18  Temp: 98.2 F (36.8 C) 97.6 F (36.4 C)  SpO2: 98% 100%    General: Afebrile, no chest pain, no palpitations, no shortness of breath.  Patient reports no nausea vomiting and is swallowing without difficulties at this time. Cardiovascular: Rate controlled, no rubs, no gallops, no JVD. Respiratory: Clear to  auscultation bilaterally; normal respiratory effort; good O2 sat on room air. Abdomen: Soft, nontender, nondistended, positive bowel sounds Extremities: No edema, no cyanosis, no clubbing.  Discharge Instructions   Discharge Instructions    Diet - low sodium heart healthy   Complete by: As directed    Discharge instructions   Complete by: As directed    Take medications as prescribed Outpatient follow-up with PCP and cardiology service Mechanical soft and heart healthy diet recommended. Maintain adequate hydration.     Allergies as of 11/21/2018      Reactions   Bee Venom Anaphylaxis      Medication List    TAKE these medications   aspirin EC 325 MG tablet Take 1 tablet (325 mg total) by mouth daily.   diltiazem 240 MG 24 hr capsule Commonly known as: Cartia XT Take 1 capsule (240 mg total) by mouth daily.   nicotine 21 mg/24hr patch Commonly known as: NICODERM CQ - dosed in mg/24 hours Place 1 patch (21 mg total) onto the skin daily. Start taking on: November 22, 2018   nystatin 100000 UNIT/ML suspension Commonly known as: MYCOSTATIN Take 5 mLs (500,000 Units total) by mouth 4 (four) times daily for 10 days.   pantoprazole 40 MG tablet Commonly known as: PROTONIX Take 1 tablet (40 mg total) by mouth 2 (two) times daily. What changed:   medication strength  how much to take  when to take this      Allergies  Allergen Reactions  . Bee Venom Anaphylaxis   Follow-up Information    Herminio Commons, MD. Schedule an appointment as soon as possible for a visit in 2 week(s).   Specialty: Cardiology Contact information: [place] [address] [phone]           The results of significant diagnostics  from this hospitalization (including imaging, microbiology, ancillary and laboratory) are listed below for reference.    Significant Diagnostic Studies: Dg Forearm Right  Result Date: 11/14/2018 Right forearm AP and lateral Initial  x-ray taken in surgery on August 28 X-ray shows for the most part a midshaft ulna fracture with a multihole plate with screws.  Fracture alignment looks good radial bow is normal fracture is healing appropriately at 6 weeks Impression stable fixation right ulna fracture with plate and screws  Dg Chest Portable 1 View  Result Date: 11/19/2018 CLINICAL DATA:  Weakness, difficulty swallowing. EXAM: PORTABLE CHEST 1 VIEW COMPARISON:  10/02/2018 FINDINGS: Cardiomediastinal contours pulmonary vasculature are unremarkable. Lungs are clear. Spinal degenerative change without acute bone finding. Metallic fragments projecting over left axilla unchanged from previous study. IMPRESSION: No signs of acute cardiopulmonary disease. Electronically Signed   By: Zetta Bills M.D.   On: 11/19/2018 17:15   Dg Esophagus W Single Cm (sol Or Thin Ba)  Result Date: 11/20/2018 CLINICAL DATA:  Dysphagia.  Unintended weight loss EXAM: ESOPHOGRAM/BARIUM SWALLOW TECHNIQUE: Single contrast examination was performed using  thin barium. FLUOROSCOPY TIME:  Fluoroscopy Time:  2 minutes 12 seconds Radiation Exposure Index (if provided by the fluoroscopic device): 10.8 mGy Number of Acquired Spot Images: 0 COMPARISON:  None. FINDINGS: Fluoroscopic evaluation of swallowing demonstrates laryngeal penetration. No frank aspiration. No fixed stricture in the upper/cervical esophagus. There is severe tertiary contractions and spasm within the mid to distal esophagus with corkscrew appearance. Full column stasis of contrast present in the upper right position. The distal esophagus never fully opens and might be related to a distal esophageal stricture. Disruption of all primary esophageal peristaltic waves. Due to the severe cord screw appearance and the apparent distal esophageal stricture, no barium tablet was administered. IMPRESSION: Severe spasm of the mid and distal esophagus with core trip ear Ince and full column stasis of contrast.  Persistent narrowing in the distal esophagus when visualized. This may reflect focal distal esophageal fixed stricture. Electronically Signed   By: Rolm Baptise M.D.   On: 11/20/2018 10:41    Microbiology: Recent Results (from the past 240 hour(s))  SARS Coronavirus 2 by RT PCR (hospital order, performed in [facility] hospital lab) Nasopharyngeal Nasopharyngeal Swab     Status: None   Collection Time: 11/19/18  4:17 PM   Specimen: Nasopharyngeal Swab  Result Value Ref Range Status   SARS Coronavirus 2 NEGATIVE NEGATIVE Final    Comment: (NOTE) If result is NEGATIVE SARS-CoV-2 target nucleic acids are NOT DETECTED. The SARS-CoV-2 RNA is generally detectable in upper and lower  respiratory specimens during the acute phase of infection. The lowest  concentration of SARS-CoV-2 viral copies this assay can detect is 250  copies / mL. A negative result does not preclude SARS-CoV-2 infection  and should not be used as the sole basis for treatment or other  patient management decisions.  A negative result may occur with  improper specimen collection / handling, submission of specimen other  than nasopharyngeal swab, presence of viral mutation(s) within the  areas targeted by this assay, and inadequate number of viral copies  (<250 copies / mL). A negative result must be combined with clinical  observations, patient history, and epidemiological information. If result is POSITIVE SARS-CoV-2 target nucleic acids are DETECTED. The SARS-CoV-2 RNA is generally detectable in upper and lower  respiratory specimens dur ing the acute phase of infection.  Positive  results are indicative of active infection with SARS-CoV-2.  Clinical  correlation with patient history and other diagnostic information is  necessary to determine patient infection status.  Positive results do  not rule out bacterial infection or co-infection with other viruses. If result is PRESUMPTIVE POSTIVE SARS-CoV-2 nucleic acids MAY  BE PRESENT.   A presumptive positive result was obtained on the submitted specimen  and confirmed on repeat testing.  While 2019 novel coronavirus  (SARS-CoV-2) nucleic acids may be present in the submitted sample  additional confirmatory testing may be necessary for epidemiological  and / or clinical management purposes  to differentiate between  SARS-CoV-2 and other Sarbecovirus currently known to infect humans.  If clinically indicated additional testing with an alternate test  methodology [id number]) is advised. The SARS-CoV-2 RNA is generally  detectable in upper and lower respiratory sp ecimens during the acute  phase of infection. The expected result is Negative. Fact Sheet for Patients:  StrictlyIdeas.no Fact Sheet for Healthcare Providers: BankingDealers.co.za This test is not yet approved or cleared by the [place] FDA and has been authorized for detection and/or diagnosis of SARS-CoV-2 by FDA under an Emergency Use Authorization (EUA).  This EUA will remain in effect (meaning this test can be used) for the duration of the COVID-19 declaration under Section 564(b)(1) of the Act, 21 U.S.C. section 360bbb-3(b)(1), unless the authorization is terminated or revoked sooner. Performed at [facility], [address], [place], [place] [address]      Labs: Basic Metabolic Panel: Recent Labs  Lab 11/19/18 1638 11/20/18 0448 11/21/18 0524  NA 140 140 138  K 3.6 3.5 3.5  CL 105 111 111  CO2 24 20* 22  GLUCOSE 101* 75 90  BUN 23 17 12   CREATININE 1.03 0.78 0.86  CALCIUM 9.2 8.3* 7.9*  MG  --  2.1  --    Liver Function Tests: Recent Labs  Lab 11/19/18 1638 11/20/18 0448  AST 41 32  ALT 25 20  ALKPHOS 78 67  BILITOT 0.7 0.8  PROT 7.3 5.7*  ALBUMIN 4.3 3.4*   CBC: Recent Labs  Lab 11/19/18 1638 11/20/18 0448 11/21/18 0524  WBC 5.6 6.5 5.2  NEUTROABS 3.7  --   --   HGB 15.0 14.1 14.3  HCT 45.5 43.5 43.8  MCV  94.8 96.5 95.0  PLT 290 266 253    Signed:  Barton Dubois MD.  [facility] 11/21/2018, 2:38 PM

## 2018-11-21 NOTE — Clinical Social Work Note (Signed)
Engineer, agricultural to [place].   Kent Davidson, Kent Pugh, LCSW

## 2018-11-27 ENCOUNTER — Encounter (HOSPITAL_COMMUNITY): Payer: Self-pay | Admitting: Internal Medicine

## 2018-11-28 ENCOUNTER — Ambulatory Visit: Payer: Non-veteran care | Admitting: Orthopedic Surgery

## 2018-12-17 ENCOUNTER — Encounter (INDEPENDENT_AMBULATORY_CARE_PROVIDER_SITE_OTHER): Payer: Self-pay | Admitting: *Deleted

## 2018-12-18 ENCOUNTER — Encounter: Payer: Self-pay | Admitting: Cardiology

## 2018-12-18 NOTE — Progress Notes (Signed)
Cardiology Office Note  Date: 12/19/2018   ID: Sang, Khatib 01/24/1956, MRN [id number]  PCP: [facility]  Consulting Cardiologist: Satira Sark, MD Electrophysiologist:  None   Chief Complaint  Patient presents with  . Atrial Fibrillation    History of Present Illness: Kent Davidson is a 63 y.o. male referred for cardiology consultation by Dr. Dyann Kief with recently documented atrial fibrillation and cardiomyopathy in the setting of esophageal impaction in October.  He had been eating steak when it got stuck, ultimately underwent EGD with removal.  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  Atrial fibrillation was documented during hospitalization, rate controlled on Cardizem CD 240 mg daily and also treated with aspirin 325 mg daily.  Chart does not indicate any prior history of hypertension, although blood pressure was elevated during hospital stay.  Echocardiography revealed LVEF approximately 45% with global hypokinesis in the setting of atrial fibrillation, mildly dilated left atrium, no major valvular abnormalities with normal estimated PASP.  Aortic root and ascending aorta were mildly dilated.  He presents today and states that he has not had any sense of palpitations.  He feels better on Protonix, has gained weight because his appetite has improved.  He does not report any dysphagia.  CHA2DS2-VASc score is 1-2.  I talked with him about his recent cardiac diagnoses and we discussed treatment options.  He has no prior history of cardiac arrhythmia.  I do not have old records but he apparently was evaluated by [facility] in 2005 with cardiac catheterization and evaluation by Dr. Mathis Bud.  PCP is at the [facility].  I personally reviewed his ECG today which shows sinus bradycardia with nonspecific ST changes.  Past Medical History:  Diagnosis  Date  . Atrial fibrillation [facility])    Documented October 2020  . Esophageal obstruction due to food impaction    October 2020  . Gastroesophageal reflux     Past Surgical History:  Procedure Laterality Date  . APPENDECTOMY    . ESOPHAGEAL DILATION  11/19/2018   Procedure: ESOPHAGEAL DILATION;  Surgeon: Rogene Houston, MD;  Location: [facility];  Service: Endoscopy;;  . ESOPHAGOGASTRODUODENOSCOPY N/A 11/19/2018   Procedure: ESOPHAGOGASTRODUODENOSCOPY (EGD);  Surgeon: Rogene Houston, MD;  Location: [facility];  Service: Endoscopy;  Laterality: N/A;  . I&D EXTREMITY Right 10/03/2018   Procedure: IRRIGATION AND DEBRIDEMENT  RIGHT ULNA;  Surgeon: Carole Civil, MD;  Location: [facility] ORS;  Service: Orthopedics;  Laterality: Right;  . ORIF ULNAR FRACTURE Right 10/03/2018   Procedure: OPEN REDUCTION INTERNAL FIXATION (ORIF) RIGHT ULNAR FRACTURE;  Surgeon: Carole Civil, MD;  Location: [facility] ORS;  Service: Orthopedics;  Laterality: Right;  . TENDON REPAIR  11/13/2011   Procedure: TENDON REPAIR;  Surgeon: Tennis Must, MD;  Location: [place];  Service: Orthopedics;  Laterality: Left;  incision and drainage with percutaneous pinning left index finger     Current Outpatient Medications  Medication Sig Dispense Refill  . diltiazem (CARTIA XT) 240 MG 24 hr capsule Take 1 capsule (240 mg total) by mouth daily. 30 capsule 3  . pantoprazole (PROTONIX) 40 MG tablet Take 1 tablet (40 mg total) by mouth 2 (two) times daily. 60 tablet 1  . aspirin EC 81 MG tablet Take 1 tablet (81 mg total) by mouth daily. 90 tablet 3  . nicotine (NICODERM CQ - DOSED IN  MG/24 HOURS) 21 mg/24hr patch Place 1 patch (21 mg total) onto the skin daily. (Patient not taking: Reported on 12/19/2018) 28 patch 1   No current facility-administered medications for this visit.    Allergies:  Bee venom   Social History: The patient  reports that he has been smoking cigarettes. He has been smoking about  0.50 packs per day. His smokeless tobacco use includes snuff. He reports that he does not drink alcohol or use drugs.   Family History: The patient's family history includes Diabetes in his mother; Heart attack in his mother; Hypertension in his mother.   ROS:  Please see the history of present illness. Otherwise, complete review of systems is positive for none.  All other systems are reviewed and negative.   Physical Exam: VS:  BP 124/76   Pulse 66   Temp 98.6 F (37 C)   Ht 5\' 11"  (1.803 m)   Wt 146 lb 12.8 oz (66.6 kg)   SpO2 98%   BMI 20.47 kg/m , BMI Body mass index is 20.47 kg/m.  Wt Readings from Last 3 Encounters:  12/19/18 146 lb 12.8 oz (66.6 kg)  11/20/18 132 lb 15 oz (60.3 kg)  11/14/18 138 lb (62.6 kg)    General: Somewhat disheveled appearing male, no distress. HEENT: Conjunctiva and lids normal, wearing a mask. Neck: Supple, no elevated JVP or carotid bruits, no thyromegaly. Lungs: Clear to auscultation, nonlabored breathing at rest. Cardiac: Regular rate and rhythm, no S3 or significant systolic murmur. Abdomen: Soft, nontender, bowel sounds present. Extremities: No pitting edema, distal pulses 2+. Skin: Warm and dry.  Excoriations. Musculoskeletal: No kyphosis. Neuropsychiatric: Alert and oriented x3, affect grossly appropriate.  ECG:  An ECG dated 11/19/2018 was personally reviewed today and demonstrated:  Atrial fibrillation with mild RVR, nonspecific ST-T changes.  Recent Labwork: 11/20/2018: ALT 20; AST 32; Magnesium 2.1; TSH 2.715 11/21/2018: BUN 12; Creatinine, Ser 0.86; Hemoglobin 14.3; Platelets 253; Potassium 3.5; Sodium 138   Other Studies Reviewed Today:  Echocardiogram 11/20/2018:  1. Left ventricular ejection fraction, by visual estimation, is 45%. The left ventricle has mildly decreased function. There is mildly increased left ventricular hypertrophy. There is global hypokinesis.  2. Left ventricular diastolic Doppler parameters are  indeterminate in the setting of atrial fibrillation.  3. Global right ventricle has normal systolic function.The right ventricular size is normal. No increase in right ventricular wall thickness.  4. Left atrial size was mildly dilated.  5. Right atrial size was mildly dilated.  6. The mitral valve is grossly normal. Trace mitral valve regurgitation.  7. The tricuspid valve is grossly normal. Tricuspid valve regurgitation is mild.  8. The aortic valve is tricuspid Aortic valve regurgitation is trivial by color flow Doppler.  9. The pulmonic valve was grossly normal. Pulmonic valve regurgitation is trivial by color flow Doppler. 10. Aortic dilatation noted. 11. There is mild dilatation of the aortic root and of the ascending aorta. 12. Normal pulmonary artery systolic pressure. 13. The inferior vena cava is normal in size with greater than 50% respiratory variability, suggesting right atrial pressure of 3 mmHg. 14. The tricuspid regurgitant velocity is 1.95 m/s, and with an assumed right atrial pressure of 10 mmHg, the estimated right ventricular systolic pressure is normal at 25.2 mmHg.  Chest x-ray 11/19/2018: FINDINGS: Cardiomediastinal contours pulmonary vasculature are unremarkable.  Lungs are clear. Spinal degenerative change without acute bone finding.  Metallic fragments projecting over left axilla unchanged from previous study.  IMPRESSION: No signs of acute  cardiopulmonary disease.  Assessment and Plan:  1.  Recent episode of transient atrial fibrillation, now in sinus rhythm by follow-up ECG.  Overall duration is unclear as he was not experiencing any palpitations.  This may have been related to the acute esophageal impaction event.  CHA2DS2-VASc score is 1-2 based on available information.  Plan at this time is to continue Cardizem CD, reduce aspirin to 81 mg daily, and follow-up with an echocardiogram in the next 6 weeks.  We can then determine whether longer-term  anticoagulation can be considered or not.  2.  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  LVEF will be reassessed in about 6 weeks now that he is back in sinus rhythm.  We can determine whether further medication changes need to be made at that time.  3.  Recent esophageal impaction with possible esophageal dysmotility.  He feels better on Protonix, has gained back some weight and is not reporting reflux at this time.  Medication Adjustments/Labs and Tests Ordered: Current medicines are reviewed at length with the patient today.  Concerns regarding medicines are outlined above.   Tests Ordered: Orders Placed This Encounter  Procedures  . EKG 12-Lead  . ECHOCARDIOGRAM COMPLETE    Medication Changes: Meds ordered this encounter  Medications  . aspirin EC 81 MG tablet    Sig: Take 1 tablet (81 mg total) by mouth daily.    Dispense:  90 tablet    Refill:  3    Disposition:  Follow up 6 to 8 weeks in the [place] office.  Signed, Satira Sark, MD, [facility] 12/19/2018 3:53 PM    [place] [facility] [address] [place], [place] [address] Phone: [phone]; Fax: [phone]

## 2018-12-19 ENCOUNTER — Other Ambulatory Visit: Payer: Self-pay

## 2018-12-19 ENCOUNTER — Encounter: Payer: Self-pay | Admitting: Cardiology

## 2018-12-19 ENCOUNTER — Ambulatory Visit (INDEPENDENT_AMBULATORY_CARE_PROVIDER_SITE_OTHER): Payer: Self-pay | Admitting: Cardiology

## 2018-12-19 VITALS — BP 124/76 | HR 66 | Temp 98.6°F | Ht 71.0 in | Wt 146.8 lb

## 2018-12-19 DIAGNOSIS — Z23 Encounter for immunization: Secondary | ICD-10-CM

## 2018-12-19 DIAGNOSIS — I429 Cardiomyopathy, unspecified: Secondary | ICD-10-CM

## 2018-12-19 DIAGNOSIS — I4819 Other persistent atrial fibrillation: Secondary | ICD-10-CM

## 2018-12-19 MED ORDER — ASPIRIN EC 81 MG PO TBEC: 81.0000 mg | DELAYED_RELEASE_TABLET | Freq: Every day | ORAL | 3 refills | Status: DC

## 2018-12-19 NOTE — Patient Instructions (Addendum)
Medication Instructions:   DECREASE Aspirin to 81 mg daily  *If you need a refill on your cardiac medications before your next appointment, please call your pharmacy*  Lab Work: None today If you have labs (blood work) drawn today and your tests are completely normal, you will receive your results only by: Marland Kitchen MyChart Message (if you have MyChart) OR . A paper copy in the mail If you have any lab test that is abnormal or we need to change your treatment, we will call you to review the results.  Testing/Procedures: Your physician has requested that you have an echocardiogram in 6-8 weeks. Echocardiography is a painless test that uses sound waves to create images of your heart. It provides your doctor with information about the size and shape of your heart and how well your heart's chambers and valves are working. This procedure takes approximately one hour. There are no restrictions for this procedure.    Follow-Up: At [facility], you and your health needs are our priority.  As part of our continuing mission to provide you with exceptional heart care, we have created designated Provider Care Teams.  These Care Teams include your primary Cardiologist (physician) and Advanced Practice Providers (APPs -  Physician Assistants and Nurse Practitioners) who all work together to provide you with the care you need, when you need it.  Your next appointment:   6-8 weeks   The format for your next appointment:   In Person  Provider:   [name], MD  Other Instructions None     Thank you for choosing [facility] !

## 2018-12-26 ENCOUNTER — Ambulatory Visit (INDEPENDENT_AMBULATORY_CARE_PROVIDER_SITE_OTHER): Payer: No Typology Code available for payment source

## 2018-12-26 ENCOUNTER — Ambulatory Visit (INDEPENDENT_AMBULATORY_CARE_PROVIDER_SITE_OTHER): Payer: No Typology Code available for payment source | Admitting: Orthopedic Surgery

## 2018-12-26 ENCOUNTER — Other Ambulatory Visit: Payer: Self-pay

## 2018-12-26 VITALS — BP 130/75 | HR 89 | Temp 97.2°F | Ht 71.0 in | Wt 146.0 lb

## 2018-12-26 DIAGNOSIS — S5291XE Unspecified fracture of right forearm, subsequent encounter for open fracture type I or II with routine healing: Secondary | ICD-10-CM

## 2018-12-26 DIAGNOSIS — Z8781 Personal history of (healed) traumatic fracture: Secondary | ICD-10-CM

## 2018-12-26 DIAGNOSIS — Z9889 Other specified postprocedural states: Secondary | ICD-10-CM

## 2018-12-26 NOTE — Progress Notes (Signed)
Chief Complaint  Patient presents with  . Follow-up    Recheck on right forearm fracture, DOS 10-03-18.    63 year old male postop visit ORIF right open fracture of the radius treated with plate and screws he is doing well has no complaints  His wound is healed nicely there is no sign of infection he has full range of motion flexion extension pronation supination wrist extension and flexion and has been really active and has no pain  His x-ray shows the fracture healed and the plan is to release him to normal activity without restriction

## 2019-02-09 ENCOUNTER — Ambulatory Visit (HOSPITAL_COMMUNITY)
Admission: RE | Admit: 2019-02-09 | Discharge: 2019-02-09 | Disposition: A | Discharge status: Home / Self Care | Payer: No Typology Code available for payment source | Source: Ambulatory Visit | Attending: Cardiology | Admitting: Cardiology

## 2019-02-09 ENCOUNTER — Other Ambulatory Visit: Payer: Self-pay

## 2019-02-09 DIAGNOSIS — I429 Cardiomyopathy, unspecified: Secondary | ICD-10-CM | POA: Diagnosis not present

## 2019-02-09 NOTE — Progress Notes (Signed)
*  PRELIMINARY RESULTS* Echocardiogram 2D Echocardiogram has been performed.  Kent Davidson 02/09/2019, 2:11 PM

## 2019-02-11 ENCOUNTER — Other Ambulatory Visit: Payer: Self-pay

## 2019-02-11 ENCOUNTER — Encounter: Payer: Self-pay | Admitting: Cardiology

## 2019-02-11 ENCOUNTER — Ambulatory Visit (INDEPENDENT_AMBULATORY_CARE_PROVIDER_SITE_OTHER): Payer: No Typology Code available for payment source | Admitting: Cardiology

## 2019-02-11 VITALS — BP 114/68 | HR 51 | Temp 97.1°F | Ht 71.0 in | Wt 145.0 lb

## 2019-02-11 DIAGNOSIS — Z8679 Personal history of other diseases of the circulatory system: Secondary | ICD-10-CM | POA: Diagnosis not present

## 2019-02-11 NOTE — Progress Notes (Signed)
Cardiology Office Note  Date: 02/11/2019   ID: Tristion, Vint Apr 07, 1955, MRN [id number]  PCP: [facility] system Cardiologist: Satira Sark, MD Electrophysiologist:  None   Chief Complaint  Patient presents with  . Cardiac follow-up    History of Present Illness: Kent Davidson is a 64 y.o. male seen in consultation back in November 2020.  He presents for a follow-up visit.  He does not report any significant palpitations or chest pain, no change in stamina.  Recent follow-up echocardiogram showed normal LVEF at 55 to 60%, no regional wall motion abnormalities, biatrial enlargement, borderline dilatation of the aortic root.  I discussed this with him today.  CHA2DS2-VASc score is 1 at this point.  Plan will be to continue observation, low-dose aspirin and Cartia XT.  Past Medical History:  Diagnosis Date  . Atrial fibrillation [facility])    Documented October 2020  . Esophageal obstruction due to food impaction    October 2020  . Gastroesophageal reflux     Past Surgical History:  Procedure Laterality Date  . APPENDECTOMY    . ESOPHAGEAL DILATION  11/19/2018   Procedure: ESOPHAGEAL DILATION;  Surgeon: Rogene Houston, MD;  [facility];  Service: Endoscopy;;  . ESOPHAGOGASTRODUODENOSCOPY N/A 11/19/2018   Procedure: ESOPHAGOGASTRODUODENOSCOPY (EGD);  Surgeon: Rogene Houston, MD;  [facility];  Service: Endoscopy;  Laterality: N/A;  . I & D EXTREMITY Right 10/03/2018   Procedure: IRRIGATION AND DEBRIDEMENT  RIGHT ULNA;  Surgeon: Carole Civil, MD;  Location: [facility] ORS;  Service: Orthopedics;  Laterality: Right;  . ORIF ULNAR FRACTURE Right 10/03/2018   Procedure: OPEN REDUCTION INTERNAL FIXATION (ORIF) RIGHT ULNAR FRACTURE;  Surgeon: Carole Civil, MD;  Location: [facility] ORS;  Service: Orthopedics;  Laterality: Right;  . TENDON REPAIR  11/13/2011   Procedure: TENDON REPAIR;  Surgeon: Tennis Must, MD;  Location: [place];  Service: Orthopedics;  Laterality: Left;  incision and drainage with percutaneous pinning left index finger     Current Outpatient Medications  Medication Sig Dispense Refill  . aspirin EC 81 MG tablet Take 1 tablet (81 mg total) by mouth daily. 90 tablet 3  . diltiazem (CARTIA XT) 240 MG 24 hr capsule Take 1 capsule (240 mg total) by mouth daily. 30 capsule 3  . pantoprazole (PROTONIX) 40 MG tablet Take 1 tablet (40 mg total) by mouth 2 (two) times daily. 60 tablet 1   No current facility-administered medications for this visit.   Allergies:  Bee venom   Social History: The patient  reports that he has been smoking cigarettes. He has been smoking about 0.50 packs per day. His smokeless tobacco use includes snuff. He reports that he does not drink alcohol or use drugs.   ROS:  Please see the history of present illness. Otherwise, complete review of systems is positive for none.  All other systems are reviewed and negative.   Physical Exam: VS:  BP 114/68   Pulse (!) 51   Temp (!) 97.1 F (36.2 C) (Temporal)   Ht 5\' 11"  (1.803 m)   Wt 145 lb (65.8 kg)   SpO2 100%   BMI 20.22 kg/m , BMI Body mass index is 20.22 kg/m.  Wt Readings from Last 3 Encounters:  02/11/19 145 lb (65.8 kg)  12/26/18 146 lb (66.2 kg)  12/19/18 146 lb 12.8 oz (66.6 kg)    General: Patient appears comfortable at rest. HEENT: Conjunctiva and lids normal,  wearing a mask. Neck: Supple, no elevated JVP or carotid bruits, no thyromegaly. Lungs: Clear to auscultation, nonlabored breathing at rest. Cardiac: Regular rate and rhythm, no S3 or significant systolic murmur, no pericardial rub. Abdomen: Soft, nontender, no hepatomegaly, bowel sounds present, no guarding or rebound. Extremities: No pitting edema, distal pulses 2+. Skin: Warm and dry. Musculoskeletal: No kyphosis. Neuropsychiatric: Alert and oriented x3, affect grossly appropriate.  ECG:  An ECG dated 12/19/2018 was personally  reviewed today and demonstrated:  Sinus bradycardia with nonspecific ST changes.  Recent Labwork: 11/20/2018: ALT 20; AST 32; Magnesium 2.1; TSH 2.715 11/21/2018: BUN 12; Creatinine, Ser 0.86; Hemoglobin 14.3; Platelets 253; Potassium 3.5; Sodium 138   Other Studies Reviewed Today:  Echocardiogram 02/09/2019:  1. Left ventricular ejection fraction, by visual estimation, is 55 to 60%. The left ventricle has normal function. There is no left ventricular hypertrophy.  2. The left ventricle has no regional wall motion abnormalities.  3. Global right ventricle has normal systolic function.The right ventricular size is normal. No increase in right ventricular wall thickness.  4. Left atrial size was moderately dilated.  5. Right atrial size was severely dilated.  6. The mitral valve is grossly normal. Trivial mitral valve regurgitation.  7. The tricuspid valve is grossly normal.  8. The aortic valve is tricuspid. Aortic valve regurgitation is not visualized.  9. The pulmonic valve was not well visualized. Pulmonic valve regurgitation is trivial. 10. Aortic dilatation noted. 11. There is borderline dilatation of the aortic root. 12. Normal pulmonary artery systolic pressure. 13. The tricuspid regurgitant velocity is 2.38 m/s, and with an assumed right atrial pressure of 3 mmHg, the estimated right ventricular systolic pressure is normal at 25.7 mmHg. 14. The inferior vena cava is normal in size with greater than 50% respiratory variability, suggesting right atrial pressure of 3 mmHg.  Assessment and Plan:  1.  History of transient atrial fibrillation.  No palpitations and heart rate regular today.  CHA2DS2-VASc score is 1 at this point.  He will stay on aspirin with plan for observation on Cartia XT.  2.  History of cardiomyopathy, LVEF 45% in October 2020 in the setting of transient atrial fibrillation and esophageal impaction.  Follow-up echocardiogram recently shows normalization of LVEF at 55  to 60%.  Medication Adjustments/Labs and Tests Ordered: Current medicines are reviewed at length with the patient today.  Concerns regarding medicines are outlined above.   Tests Ordered: No orders of the defined types were placed in this encounter.   Medication Changes: No orders of the defined types were placed in this encounter.   Disposition:  Follow up 1 year in the [place] office.  Signed, Satira Sark, MD, [facility] 02/11/2019 1:26 PM    [place] [facility] [address], [place],  [address] Phone: [phone]; Fax: [phone]

## 2019-02-11 NOTE — Patient Instructions (Signed)
Medication Instructions:  Your physician recommends that you continue on your current medications as directed. Please refer to the Current Medication list given to you today.  *If you need a refill on your cardiac medications before your next appointment, please call your pharmacy*  Lab Work: NONE If you have labs (blood work) drawn today and your tests are completely normal, you will receive your results only by: Marland Kitchen MyChart Message (if you have MyChart) OR . A paper copy in the mail If you have any lab test that is abnormal or we need to change your treatment, we will call you to review the results.  Testing/Procedures: NONE  Follow-Up: At [facility], you and your health needs are our priority.  As part of our continuing mission to provide you with exceptional heart care, we have created designated Provider Care Teams.  These Care Teams include your primary Cardiologist (physician) and Advanced Practice Providers (APPs -  Physician Assistants and Nurse Practitioners) who all work together to provide you with the care you need, when you need it.  Your next appointment:   12 month(s)  The format for your next appointment:   In Person  Provider:   [name], MD  Other Instructions None

## 2019-05-10 ENCOUNTER — Emergency Department (HOSPITAL_COMMUNITY): Payer: No Typology Code available for payment source

## 2019-05-10 ENCOUNTER — Other Ambulatory Visit: Payer: Self-pay

## 2019-05-10 ENCOUNTER — Inpatient Hospital Stay (HOSPITAL_COMMUNITY)
Admission: EM | Admit: 2019-05-10 | Discharge: 2019-05-13 | DRG: 310 | Disposition: A | Discharge status: Home / Self Care | Payer: No Typology Code available for payment source | Attending: Internal Medicine | Admitting: Internal Medicine

## 2019-05-10 ENCOUNTER — Encounter (HOSPITAL_COMMUNITY): Payer: Self-pay | Admitting: Emergency Medicine

## 2019-05-10 DIAGNOSIS — Z8249 Family history of ischemic heart disease and other diseases of the circulatory system: Secondary | ICD-10-CM

## 2019-05-10 DIAGNOSIS — I4819 Other persistent atrial fibrillation: Secondary | ICD-10-CM

## 2019-05-10 DIAGNOSIS — I4891 Unspecified atrial fibrillation: Secondary | ICD-10-CM | POA: Diagnosis present

## 2019-05-10 DIAGNOSIS — I429 Cardiomyopathy, unspecified: Secondary | ICD-10-CM | POA: Diagnosis present

## 2019-05-10 DIAGNOSIS — I480 Paroxysmal atrial fibrillation: Secondary | ICD-10-CM | POA: Diagnosis present

## 2019-05-10 DIAGNOSIS — R42 Dizziness and giddiness: Secondary | ICD-10-CM | POA: Diagnosis not present

## 2019-05-10 DIAGNOSIS — R001 Bradycardia, unspecified: Secondary | ICD-10-CM | POA: Diagnosis not present

## 2019-05-10 DIAGNOSIS — Z79899 Other long term (current) drug therapy: Secondary | ICD-10-CM

## 2019-05-10 DIAGNOSIS — K2100 Gastro-esophageal reflux disease with esophagitis, without bleeding: Secondary | ICD-10-CM

## 2019-05-10 DIAGNOSIS — Z87892 Personal history of anaphylaxis: Secondary | ICD-10-CM

## 2019-05-10 DIAGNOSIS — Z20822 Contact with and (suspected) exposure to covid-19: Secondary | ICD-10-CM | POA: Diagnosis present

## 2019-05-10 DIAGNOSIS — Z91030 Bee allergy status: Secondary | ICD-10-CM

## 2019-05-10 DIAGNOSIS — F17210 Nicotine dependence, cigarettes, uncomplicated: Secondary | ICD-10-CM | POA: Diagnosis present

## 2019-05-10 LAB — CBC WITH DIFFERENTIAL/PLATELET
Abs Immature Granulocytes: 0.01 10*3/uL (ref 0.00–0.07)
Basophils Absolute: 0 10*3/uL (ref 0.0–0.1)
Basophils Relative: 1 %
Eosinophils Absolute: 0.1 10*3/uL (ref 0.0–0.5)
Eosinophils Relative: 1 %
HCT: 40.3 % (ref 39.0–52.0)
Hemoglobin: 13.2 g/dL (ref 13.0–17.0)
Immature Granulocytes: 0 %
Lymphocytes Relative: 26 %
Lymphs Abs: 1.3 10*3/uL (ref 0.7–4.0)
MCH: 31.8 pg (ref 26.0–34.0)
MCHC: 32.8 g/dL (ref 30.0–36.0)
MCV: 97.1 fL (ref 80.0–100.0)
Monocytes Absolute: 0.4 10*3/uL (ref 0.1–1.0)
Monocytes Relative: 8 %
Neutro Abs: 3.2 10*3/uL (ref 1.7–7.7)
Neutrophils Relative %: 64 %
Platelets: 263 10*3/uL (ref 150–400)
RBC: 4.15 MIL/uL — ABNORMAL LOW (ref 4.22–5.81)
RDW: 14 % (ref 11.5–15.5)
WBC: 4.9 10*3/uL (ref 4.0–10.5)
nRBC: 0 % (ref 0.0–0.2)

## 2019-05-10 LAB — COMPREHENSIVE METABOLIC PANEL
ALT: 32 U/L (ref 0–44)
AST: 40 U/L (ref 15–41)
Albumin: 3.8 g/dL (ref 3.5–5.0)
Alkaline Phosphatase: 52 U/L (ref 38–126)
Anion gap: 7 (ref 5–15)
BUN: 10 mg/dL (ref 8–23)
CO2: 25 mmol/L (ref 22–32)
Calcium: 8.8 mg/dL — ABNORMAL LOW (ref 8.9–10.3)
Chloride: 104 mmol/L (ref 98–111)
Creatinine, Ser: 0.9 mg/dL (ref 0.61–1.24)
GFR calc Af Amer: >60 mL/min (ref >60)
GFR calc non Af Amer: >60 mL/min (ref >60)
Glucose, Bld: 123 mg/dL — ABNORMAL HIGH (ref 70–99)
Potassium: 3.7 mmol/L (ref 3.5–5.1)
Sodium: 136 mmol/L (ref 135–145)
Total Bilirubin: 0.6 mg/dL (ref 0.3–1.2)
Total Protein: 6.7 g/dL (ref 6.5–8.1)

## 2019-05-10 LAB — TROPONIN I (HIGH SENSITIVITY): Troponin I (High Sensitivity): 5 ng/L (ref <18)

## 2019-05-10 MED ORDER — ONDANSETRON HCL 4 MG/2ML IJ SOLN: 4.0000 mg | Freq: Four times a day (QID) | INTRAMUSCULAR | Status: DC | PRN

## 2019-05-10 MED ORDER — SODIUM CHLORIDE 0.9 % IV SOLN: INTRAVENOUS | Status: DC

## 2019-05-10 MED ORDER — PANTOPRAZOLE SODIUM 40 MG PO TBEC
40.0000 mg | DELAYED_RELEASE_TABLET | Freq: Two times a day (BID) | ORAL | Status: DC
  Administered 2019-05-10 – 2019-05-13 (×6): 40 mg via ORAL
  Filled 2019-05-10 (×6): qty 1

## 2019-05-10 MED ORDER — ASPIRIN EC 81 MG PO TBEC
81.0000 mg | DELAYED_RELEASE_TABLET | Freq: Every day | ORAL | Status: DC
  Administered 2019-05-10 – 2019-05-13 (×4): 81 mg via ORAL
  Filled 2019-05-10 (×4): qty 1

## 2019-05-10 MED ORDER — ACETAMINOPHEN 650 MG RE SUPP: 650.0000 mg | Freq: Four times a day (QID) | RECTAL | Status: DC | PRN

## 2019-05-10 MED ORDER — ACETAMINOPHEN 325 MG PO TABS
650.0000 mg | ORAL_TABLET | Freq: Four times a day (QID) | ORAL | Status: DC | PRN
  Administered 2019-05-12: 650 mg via ORAL
  Filled 2019-05-10: qty 2

## 2019-05-10 MED ORDER — ONDANSETRON HCL 4 MG PO TABS: 4.0000 mg | ORAL_TABLET | Freq: Four times a day (QID) | ORAL | Status: DC | PRN

## 2019-05-10 MED ORDER — ENOXAPARIN SODIUM 40 MG/0.4ML ~~LOC~~ SOLN
40.0000 mg | SUBCUTANEOUS | Status: DC
  Administered 2019-05-10 – 2019-05-12 (×3): 40 mg via SUBCUTANEOUS
  Filled 2019-05-10 (×3): qty 0.4

## 2019-05-10 NOTE — H&P (Signed)
History and Physical    Kent Davidson [id number] DOB: [birth year] DOA: 05/10/2019  PCP: System, Pcp Not In  Patient coming from: Home  I have personally briefly reviewed patient's old medical records in [place]  Chief Complaint: Dizziness  HPI: Kent Davidson is a 64 y.o. male with medical history significant of atrial fibrillation, not on anticoagulation, GERD, presents to the hospital with complaints of dizziness.  Patient reports intermittent symptoms for the last several weeks.  He reports that today symptoms have gotten worse which led him to the hospital.  Symptoms were not present at rest.  When the patient ambulates, after about 10 or 15 seconds he begins to feel as though he is losing his balance.  He denies any vertigo.  Denies any chest pain or shortness of breath.  Once he rests, his symptoms do improve.  He occasionally has blurry vision when he exerts himself.  No unilateral weakness or numbness.  He has no fever, cough, vomiting, diarrhea.  His p.o. intake has been adequate.  He is not had any recent changes in medications.  ED Course: On evaluation in the emergency room, heart rate noted to be in the 40 to 45 bpm range.  EKG shows sinus bradycardia.  CT head was negative.  Remainder of basic labs were unrevealing.  At rest, patient did not have any symptoms, but upon ambulation his heart rate was noted to stay in the low 40s and he became symptomatic.  He has been referred for admission.  Review of Systems: As per HPI otherwise 10 point review of systems negative.    Past Medical History:  Diagnosis Date  . Atrial fibrillation [facility])    Documented October 2020  . Esophageal obstruction due to food impaction    October 2020  . Gastroesophageal reflux     Past Surgical History:  Procedure Laterality Date  . APPENDECTOMY    . ESOPHAGEAL DILATION  11/19/2018   Procedure: ESOPHAGEAL DILATION;  Surgeon: Rogene Houston, MD;  [facility];   Service: Endoscopy;;  . ESOPHAGOGASTRODUODENOSCOPY N/A 11/19/2018   Procedure: ESOPHAGOGASTRODUODENOSCOPY (EGD);  Surgeon: Rogene Houston, MD;  [facility];  Service: Endoscopy;  Laterality: N/A;  . I & D EXTREMITY Right 10/03/2018   Procedure: IRRIGATION AND DEBRIDEMENT  RIGHT ULNA;  Surgeon: Carole Civil, MD;  Location: [facility] ORS;  Service: Orthopedics;  Laterality: Right;  . ORIF ULNAR FRACTURE Right 10/03/2018   Procedure: OPEN REDUCTION INTERNAL FIXATION (ORIF) RIGHT ULNAR FRACTURE;  Surgeon: Carole Civil, MD;  Location: [facility];  Service: Orthopedics;  Laterality: Right;  . TENDON REPAIR  11/13/2011   Procedure: TENDON REPAIR;  Surgeon: Tennis Must, MD;  Location: [place];  Service: Orthopedics;  Laterality: Left;  incision and drainage with percutaneous pinning left index finger     Social History:  reports that he has been smoking cigarettes. He has been smoking about 0.50 packs per day. His smokeless tobacco use includes snuff. He reports that he does not drink alcohol or use drugs.  Allergies  Allergen Reactions  . Bee Venom Anaphylaxis    Family History  Problem Relation Age of Onset  . Diabetes Mother   . Hypertension Mother   . Heart attack Mother     Prior to Admission medications   Medication Sig Start Date End Date Taking? Authorizing Provider  aspirin EC 81 MG tablet Take 1 tablet (81 mg total) by mouth daily. 12/19/18  Yes  Satira Sark, MD  diltiazem (CARTIA XT) 240 MG 24 hr capsule Take 1 capsule (240 mg total) by mouth daily. 11/21/18 11/21/19 Yes Barton Dubois, MD  pantoprazole (PROTONIX) 40 MG tablet Take 1 tablet (40 mg total) by mouth 2 (two) times daily. 11/21/18  Yes Barton Dubois, MD    Physical Exam: Vitals:   05/10/19 1330 05/10/19 1400 05/10/19 1430 05/10/19 1505  BP: 127/79 132/75 129/78 137/90  Pulse: (!) 41 (!) 39 (!) 38 (!) 40  Resp: 12   18  Temp:    97.6 F (36.4 C)  TempSrc:    Oral  SpO2:  99% 100% 100% 100%  Weight:      Height:        Constitutional: NAD, calm, comfortable Eyes: PERRL, lids and conjunctivae normal ENMT: Mucous membranes are moist. Posterior pharynx clear of any exudate or lesions.Normal dentition.  Neck: normal, supple, no masses, no thyromegaly Respiratory: clear to auscultation bilaterally, no wheezing, no crackles. Normal respiratory effort. No accessory muscle use.  Cardiovascular: Regular rate and rhythm, no murmurs / rubs / gallops. No extremity edema. 2+ pedal pulses. No carotid bruits.  Abdomen: no tenderness, no masses palpated. No hepatosplenomegaly. Bowel sounds positive.  Musculoskeletal: no clubbing / cyanosis. No joint deformity upper and lower extremities. Good ROM, no contractures. Normal muscle tone.  Skin: no rashes, lesions, ulcers. No induration Neurologic: CN 2-12 grossly intact. Sensation intact, DTR normal. Strength 5/5 in all 4.  Psychiatric: Normal judgment and insight. Alert and oriented x 3. Normal mood.    Labs on Admission: I have personally reviewed following labs and imaging studies  CBC: Recent Labs  Lab 05/10/19 1210  WBC 4.9  NEUTROABS 3.2  HGB 13.2  HCT 40.3  MCV 97.1  PLT 99991111   Basic Metabolic Panel: Recent Labs  Lab 05/10/19 1210  NA 136  K 3.7  CL 104  CO2 25  GLUCOSE 123*  BUN 10  CREATININE 0.90  CALCIUM 8.8*   GFR: Estimated Creatinine Clearance: 74.5 mL/min (by C-G formula based on SCr of 0.9 mg/dL). Liver Function Tests: Recent Labs  Lab 05/10/19 1210  AST 40  ALT 32  ALKPHOS 52  BILITOT 0.6  PROT 6.7  ALBUMIN 3.8   No results for input(s): LIPASE, AMYLASE in the last 168 hours. No results for input(s): AMMONIA in the last 168 hours. Coagulation Profile: No results for input(s): INR, PROTIME in the last 168 hours. Cardiac Enzymes: No results for input(s): CKTOTAL, CKMB, CKMBINDEX, TROPONINI in the last 168 hours. BNP (last 3 results) No results for input(s): PROBNP in the last  8760 hours. HbA1C: No results for input(s): HGBA1C in the last 72 hours. CBG: No results for input(s): GLUCAP in the last 168 hours. Lipid Profile: No results for input(s): CHOL, HDL, LDLCALC, TRIG, CHOLHDL, LDLDIRECT in the last 72 hours. Thyroid Function Tests: No results for input(s): TSH, T4TOTAL, FREET4, T3FREE, THYROIDAB in the last 72 hours. Anemia Panel: No results for input(s): VITAMINB12, FOLATE, FERRITIN, TIBC, IRON, RETICCTPCT in the last 72 hours. Urine analysis:    Component Value Date/Time   COLORURINE AMBER (A) 07/27/2017 1220   APPEARANCEUR HAZY (A) 07/27/2017 1220   LABSPEC 1.039 (H) 07/27/2017 1220   PHURINE 5.0 07/27/2017 1220   GLUCOSEU NEGATIVE 07/27/2017 1220   HGBUR NEGATIVE 07/27/2017 1220   BILIRUBINUR NEGATIVE 07/27/2017 1220   KETONESUR 5 (A) 07/27/2017 1220   PROTEINUR 30 (A) 07/27/2017 1220   NITRITE NEGATIVE 07/27/2017 1220   LEUKOCYTESUR NEGATIVE 07/27/2017  [place] on Admission: DG Chest 1 View  Result Date: 05/10/2019 CLINICAL DATA:  Bradycardia and dizziness. EXAM: CHEST  1 VIEW COMPARISON:  11/20/2018 FINDINGS: The cardiac silhouette, mediastinal and hilar contours are within normal limits and stable. The lungs are clear. No pleural effusions or pneumothorax. No worrisome pulmonary lesions. The bony thorax is intact. IMPRESSION: No acute cardiopulmonary findings. Electronically Signed   By: Marijo Sanes M.D.   On: 05/10/2019 12:55   CT Head Wo Contrast  Result Date: 05/10/2019 CLINICAL DATA:  Ataxia. Stroke suspected. EXAM: CT HEAD WITHOUT CONTRAST TECHNIQUE: Contiguous axial images were obtained from the base of the skull through the vertex without intravenous contrast. COMPARISON:  August 29, 2001 FINDINGS: Brain: No evidence of acute infarction, hemorrhage, hydrocephalus, extra-axial collection or mass lesion/mass effect. Vascular: No hyperdense vessel or unexpected calcification. Skull: Normal. Negative for fracture or focal  lesion. Sinuses/Orbits: No acute finding. Other: None. IMPRESSION: No acute intracranial abnormality. Electronically Signed   By: Fidela Salisbury M.D.   On: 05/10/2019 12:46    EKG: Independently reviewed.  Sinus bradycardia  Assessment/Plan Active Problems:   Atrial fibrillation (HCC)   Gastroesophageal reflux disease with esophagitis without hemorrhage   Symptomatic bradycardia     1. Symptomatic bradycardia.  Currently, this appears to be the most likely cause of patient's symptoms.  The patient does not have any symptoms while sitting/laying down.  It only when he exerts himself.  He is likely not able to mount an appropriate chronotropic response to exertion.  He is chronically on diltiazem which will be held.  Monitor on telemetry.  Cardiac enzymes are negative and is not having any chest pain.  If heart rate corrects and patient still has continued symptoms, could consider further neuroimaging at that point.  Since his symptoms have been present for several weeks, there does not appear to be an urgent need for MRI at this time.  Orthostatics were checked in the emergency room and found to be negative. 2. Atrial fibrillation.  Currently in sinus rhythm.  CHADSVASc score of 1.  He has been monitored on aspirin. 3. GERD.  Continue PPI.  DVT prophylaxis: Lovenox Code Status: Full code Family Communication: Discussed with patient Disposition Plan: Discharge home once patient is able to ambulate without significant symptoms Consults called:   Admission status: Observation, telemetry  Kathie Dike MD [facility]   If 7PM-7AM, please contact night-coverage [email or web address]   05/10/2019, 5:58 PM

## 2019-05-10 NOTE — ED Notes (Signed)
Orthostatic complete, pt reports minor dizziness when going from laying to sitting, pt denies dizziness when going from sitting to standing and when standing.  Pt steady on his feet.  Pt reports some dizziness when he turns his head from side to side.  md notified

## 2019-05-10 NOTE — ED Notes (Signed)
Pt in bed with eyes closed, pt arouses easily to verbal stim, pt denies dizziness, denies pain, pt awaits transport

## 2019-05-10 NOTE — ED Triage Notes (Signed)
Dizziness x 1 month.   Pt feels like his RT ear is stopped up

## 2019-05-10 NOTE — ED Notes (Signed)
Pt returned from CT °

## 2019-05-10 NOTE — ED Provider Notes (Signed)
[facility] EMERGENCY DEPARTMENT Provider Note   CSN: [id number] Arrival date & time: 05/10/19  1143     History Chief Complaint  Patient presents with  . Dizziness    Kent Davidson is a 64 y.o. male.  HPI   This patient is a 64 year old male, he has a known history of atrial fibrillation, history of acid reflux, takes only a baby aspirin, diltiazem and pantoprazole.  The patient reports that over the last month or so he has developed intermittent episodes where he feels lightheaded, feels like he is off balance like his equilibrium is off.  During these episodes he will often have some visual blurriness, he does not feel like he is going to pass out and has no chest pain or palpitations.  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  He states he feels like he is drunk but has not had alcohol in 25 years.  He does smoke about a pack of cigarettes a day, he denies fevers chills nausea vomiting or diarrhea and has had a normal appetite.  Denies any significant coughing shortness of breath or chest pain and has no palpitations when this occurs.  Denies swelling of the legs, bleeding in the stools, numbness or weakness.  At this time while he is sitting down he does not feel bad at all, he states it mostly happens when he is up trying to do his job.  Past Medical History:  Diagnosis Date  . Atrial fibrillation [facility])    Documented October 2020  . Esophageal obstruction due to food impaction    October 2020  . Gastroesophageal reflux     Patient Active Problem List   Diagnosis Date Noted  . Bradycardia 05/10/2019  . Esophageal obstruction due to food impaction   . Gastroesophageal reflux disease with esophagitis without hemorrhage   . Esophageal candidiasis ([place])   . Essential hypertension   . Atrial fibrillation ([place]) 11/20/2018  . S/P ORIF (open reduction  internal fixation) fracture right elbow 10/03/18 10/09/2018  . Forearm fracture, right, open type I or II, initial encounter 10/03/18     Past Surgical History:  Procedure Laterality Date  . APPENDECTOMY    . ESOPHAGEAL DILATION  11/19/2018   Procedure: ESOPHAGEAL DILATION;  Surgeon: Rogene Houston, MD;  Location: [facility] ENDO SUITE;  Service: Endoscopy;;  . ESOPHAGOGASTRODUODENOSCOPY N/A 11/19/2018   Procedure: ESOPHAGOGASTRODUODENOSCOPY (EGD);  Surgeon: Rogene Houston, MD;  Location: [facility] ENDO SUITE;  Service: Endoscopy;  Laterality: N/A;  . I & D EXTREMITY Right 10/03/2018   Procedure: IRRIGATION AND DEBRIDEMENT  RIGHT ULNA;  Surgeon: Carole Civil, MD;  Location: [facility] ORS;  Service: Orthopedics;  Laterality: Right;  . ORIF ULNAR FRACTURE Right 10/03/2018   Procedure: OPEN REDUCTION INTERNAL FIXATION (ORIF) RIGHT ULNAR FRACTURE;  Surgeon: Carole Civil, MD;  Location: [facility] ORS;  Service: Orthopedics;  Laterality: Right;  . TENDON REPAIR  11/13/2011   Procedure: TENDON REPAIR;  Surgeon: Tennis Must, MD;  Location: [place];  Service: Orthopedics;  Laterality: Left;  incision and drainage with percutaneous pinning left index finger        Family History  Problem Relation Age of Onset  . Diabetes Mother   . Hypertension Mother   . Heart attack Mother     Social History   Tobacco Use  . Smoking status: Current  Every Day Smoker    Packs/day: 0.50    Types: Cigarettes  . Smokeless tobacco: Current User    Types: Snuff  Substance Use Topics  . Alcohol use: No  . Drug use: No    Home Medications Prior to Admission medications   Medication Sig Start Date End Date Taking? Authorizing Provider  aspirin EC 81 MG tablet Take 1 tablet (81 mg total) by mouth daily. 12/19/18  Yes Satira Sark, MD  diltiazem (CARTIA XT) 240 MG 24 hr capsule Take 1 capsule (240 mg total) by mouth daily. 11/21/18 11/21/19 Yes Barton Dubois, MD  pantoprazole (PROTONIX) 40 MG  tablet Take 1 tablet (40 mg total) by mouth 2 (two) times daily. 11/21/18  Yes Barton Dubois, MD    Allergies    Bee venom  Review of Systems   Review of Systems  All other systems reviewed and are negative.   Physical Exam Updated Vital Signs BP 108/74 (BP Location: Left Arm)   Pulse (!) 45   Temp 98.1 F (36.7 C) (Oral)   Resp 18   Ht 1.803 m (5\' 11" )   Wt 63.5 kg   SpO2 100%   BMI 19.53 kg/m   Physical Exam Vitals and nursing note reviewed.  Constitutional:      General: He is not in acute distress.    Appearance: He is well-developed.  HENT:     Head: Normocephalic and atraumatic.     Comments: Muscle wasting in the temporal regions of the skull    Nose: Nose normal.     Mouth/Throat:     Mouth: Mucous membranes are moist.     Pharynx: No oropharyngeal exudate.  Eyes:     General: No scleral icterus.       Right eye: No discharge.        Left eye: No discharge.     Conjunctiva/sclera: Conjunctivae normal.     Pupils: Pupils are equal, round, and reactive to light.  Neck:     Thyroid: No thyromegaly.     Vascular: No JVD.     Comments: No carotid bruit auscultated, no JVD Cardiovascular:     Rate and Rhythm: Regular rhythm. Bradycardia present.     Heart sounds: Normal heart sounds. No murmur. No friction rub. No gallop.      Comments: Bradycardic to 45 bpm, normal pulses at the radial arteries, no edema Pulmonary:     Effort: Pulmonary effort is normal. No respiratory distress.     Breath sounds: Normal breath sounds. No wheezing or rales.  Abdominal:     General: Bowel sounds are normal. There is no distension.     Palpations: Abdomen is soft. There is no mass.     Tenderness: There is no abdominal tenderness.  Musculoskeletal:        General: No tenderness. Normal range of motion.     Cervical back: Normal range of motion and neck supple.  Lymphadenopathy:     Cervical: No cervical adenopathy.  Skin:    General: Skin is warm and dry.      Findings: No erythema or rash.  Neurological:     Mental Status: He is alert.     Coordination: Coordination normal.     Comments: The patient has normal speech and coordination, his cranial nerves III through XII are normal, he is able to perform finger-nose-finger without any difficulties, has no pronator drift, is able to grip bilaterally and straight leg raise bilaterally without any weakness, he  is symmetrically strong.  When I ambulate with the patient he is able to take 3 or 4 steps before he becomes lightheaded, feels like his equilibrium is off, has changes in his vision.  As he rests for 30 seconds to a minute this improves, it comes back again when he exerts himself again.  He is able to ambulate with a steady gait otherwise.  Psychiatric:        Behavior: Behavior normal.     ED Results / Procedures / Treatments   Labs (all labs ordered are listed, but only abnormal results are displayed) Labs Reviewed  CBC WITH DIFFERENTIAL/PLATELET - Abnormal; Notable for the following components:      Result Value   RBC 4.15 (*)    All other components within normal limits  COMPREHENSIVE METABOLIC PANEL - Abnormal; Notable for the following components:   Glucose, Bld 123 (*)    Calcium 8.8 (*)    All other components within normal limits  SARS CORONAVIRUS 2 (TAT 6-24 HRS)  TROPONIN I (HIGH SENSITIVITY)    EKG EKG Interpretation  Date/Time:  Sunday May 10 2019 11:54:22 EDT Ventricular Rate:  44 PR Interval:    QRS Duration: 137 QT Interval:  523 QTC Calculation: 448 R Axis:   54 Text Interpretation: Sinus bradycardia Borderline short PR interval IVCD, consider atypical RBBB Baseline wander in lead(s) V2 Since last tracing Atrial fibrillation replaced with sinus bradycardia Confirmed by Noemi Chapel [phone]) on 05/10/2019 12:11:49 PM   Radiology DG Chest 1 View  Result Date: 05/10/2019 CLINICAL DATA:  Bradycardia and dizziness. EXAM: CHEST  1 VIEW COMPARISON:  11/20/2018  FINDINGS: The cardiac silhouette, mediastinal and hilar contours are within normal limits and stable. The lungs are clear. No pleural effusions or pneumothorax. No worrisome pulmonary lesions. The bony thorax is intact. IMPRESSION: No acute cardiopulmonary findings. Electronically Signed   By: Marijo Sanes M.D.   On: 05/10/2019 12:55   CT Head Wo Contrast  Result Date: 05/10/2019 CLINICAL DATA:  Ataxia. Stroke suspected. EXAM: CT HEAD WITHOUT CONTRAST TECHNIQUE: Contiguous axial images were obtained from the base of the skull through the vertex without intravenous contrast. COMPARISON:  August 29, 2001 FINDINGS: Brain: No evidence of acute infarction, hemorrhage, hydrocephalus, extra-axial collection or mass lesion/mass effect. Vascular: No hyperdense vessel or unexpected calcification. Skull: Normal. Negative for fracture or focal lesion. Sinuses/Orbits: No acute finding. Other: None. IMPRESSION: No acute intracranial abnormality. Electronically Signed   By: Fidela Salisbury M.D.   On: 05/10/2019 12:46    Procedures Procedures (including critical care time)  Medications Ordered in ED Medications  0.9 %  sodium chloride infusion (has no administration in time range)    ED Course  I have reviewed the triage vital signs and the nursing notes.  Pertinent labs & imaging results that were available during my care of the patient were reviewed by me and considered in my medical decision making (see chart for details).    MDM Rules/Calculators/A&P                      This patient has unremarkable vital signs except for his heart rate of 45 beats a minute, his EKG is normal sinus bradycardia with no ischemia or significant arrhythmia other than the mild bradycardia.  At this time the patient may be experiencing symptoms related to bradycardia, would also consider that he could be having cerebral ischemia, posterior circulation symptoms with visual changes and of ataxia, will start with a  CT scan of  the brain labs and cardiac monitoring.  Patient agreeable  I discussed the case at length with the hospitalist, Dr. Roderic Palau, he is agreeable to admit the patient to the hospital.  We will need to hold his calcium channel blocker to make sure that the bradycardia resolves.  He has no focal neurologic symptoms and with a negative CT scan and chest x-ray and otherwise normal lab work there is no indication to be more aggressive about MRI imaging at this time.  Patient is agreeable, consultant will come see the patient for admission.  Covid test pending  Kent Davidson was evaluated in Emergency Department on 05/10/2019 for the symptoms described in the history of present illness. He was evaluated in the context of the global COVID-19 pandemic, which necessitated consideration that the patient might be at risk for infection with the SARS-CoV-2 virus that causes COVID-19. Institutional protocols and algorithms that pertain to the evaluation of patients at risk for COVID-19 are in a state of rapid change based on information released by regulatory bodies including the CDC and federal and state organizations. These policies and algorithms were followed during the patient's care in the ED.   Final Clinical Impression(s) / ED Diagnoses Final diagnoses:  Bradycardia  Dizziness    Rx / DC Orders ED Discharge Orders    None       Noemi Chapel, MD 05/10/19 1336

## 2019-05-11 DIAGNOSIS — R42 Dizziness and giddiness: Secondary | ICD-10-CM | POA: Diagnosis not present

## 2019-05-11 DIAGNOSIS — I4819 Other persistent atrial fibrillation: Secondary | ICD-10-CM | POA: Diagnosis not present

## 2019-05-11 DIAGNOSIS — K2100 Gastro-esophageal reflux disease with esophagitis, without bleeding: Secondary | ICD-10-CM | POA: Diagnosis not present

## 2019-05-11 DIAGNOSIS — R001 Bradycardia, unspecified: Secondary | ICD-10-CM | POA: Diagnosis not present

## 2019-05-11 LAB — CBC
HCT: 37.9 % — ABNORMAL LOW (ref 39.0–52.0)
Hemoglobin: 12.3 g/dL — ABNORMAL LOW (ref 13.0–17.0)
MCH: 31.2 pg (ref 26.0–34.0)
MCHC: 32.5 g/dL (ref 30.0–36.0)
MCV: 96.2 fL (ref 80.0–100.0)
Platelets: 234 10*3/uL (ref 150–400)
RBC: 3.94 MIL/uL — ABNORMAL LOW (ref 4.22–5.81)
RDW: 14.1 % (ref 11.5–15.5)
WBC: 3.9 10*3/uL — ABNORMAL LOW (ref 4.0–10.5)
nRBC: 0 % (ref 0.0–0.2)

## 2019-05-11 LAB — BASIC METABOLIC PANEL
Anion gap: 5 (ref 5–15)
BUN: 13 mg/dL (ref 8–23)
CO2: 26 mmol/L (ref 22–32)
Calcium: 8.4 mg/dL — ABNORMAL LOW (ref 8.9–10.3)
Chloride: 109 mmol/L (ref 98–111)
Creatinine, Ser: 1.05 mg/dL (ref 0.61–1.24)
GFR calc Af Amer: >60 mL/min (ref >60)
GFR calc non Af Amer: >60 mL/min (ref >60)
Glucose, Bld: 91 mg/dL (ref 70–99)
Potassium: 3.9 mmol/L (ref 3.5–5.1)
Sodium: 140 mmol/L (ref 135–145)

## 2019-05-11 LAB — TSH: TSH: 5.948 u[IU]/mL — ABNORMAL HIGH (ref 0.350–4.500)

## 2019-05-11 LAB — SARS CORONAVIRUS 2 (TAT 6-24 HRS): SARS Coronavirus 2: NEGATIVE

## 2019-05-11 NOTE — Plan of Care (Signed)
  Problem: Education: Goal: Knowledge of General Education information will improve Description: Including pain rating scale, medication(s)/side effects and non-pharmacologic comfort measures Outcome: Progressing   Problem: Clinical Measurements: Goal: Ability to maintain clinical measurements within normal limits will improve Outcome: Progressing Goal: Will remain free from infection Outcome: Progressing Goal: Diagnostic test results will improve Outcome: Progressing Goal: Respiratory complications will improve Outcome: Progressing Goal: Cardiovascular complication will be avoided Outcome: Progressing   Problem: Activity: Goal: Risk for activity intolerance will decrease Outcome: Progressing   Problem: Pain Managment: Goal: General experience of comfort will improve Outcome: Progressing   Problem: Skin Integrity: Goal: Risk for impaired skin integrity will decrease Outcome: Progressing

## 2019-05-11 NOTE — Progress Notes (Signed)
PROGRESS NOTE    GENEVIEVE VIZZINI  [id number] DOB: March 02, 1955 DOA: 05/10/2019 PCP: System, Pcp Not In    Brief Narrative:  64 y/o male with a history of A fib on cardizem was admitted to the hospital with symptomatic bradycardia. Patient had heart rate in low 40s and was becoming lightheaded on ambulation/exertion. Diltiazem held on admission and he is being monitored for improvement. If heart rate does not improve, can consider cardiology consult   Assessment & Plan:   Active Problems:   Atrial fibrillation (HCC)   Gastroesophageal reflux disease with esophagitis without hemorrhage   Symptomatic bradycardia   1. Symptomatic bradycardia.  Currently, this appears to be the most likely cause of patient's symptoms.  The patient does not have any symptoms while sitting/laying down.  It only occurs when he exerts himself.  He is likely not able to mount an appropriate chronotropic response to exertion.  He is chronically on diltiazem which has been held.  HR remains bradycardic today. Will monitor for another 24 hours and if heart rate remains low, then can consider cardiology consult. Cardiac enzymes are negative and he is not having any chest pain.   Orthostatics were checked in the emergency room and found to be negative. 2. Atrial fibrillation.  Currently in sinus rhythm.  CHADSVASc score of 1.  He has been monitored on aspirin. 3. GERD.  Continue PPI.   DVT prophylaxis: lovenox Code Status: full code Family Communication: none present Disposition Plan: discharge home once patient is able to ambulate without symptoms   Consultants:     Procedures:     Antimicrobials:       Subjective: Still has dizziness on exertion, no chest pain  Objective: Vitals:   05/11/19 0902 05/11/19 1333 05/11/19 1952 05/11/19 2123  BP:  110/71  102/68  Pulse:  (!) 48  (!) 43  Resp:  16  18  Temp:  98 F (36.7 C)  98.4 F (36.9 C)  TempSrc:  Oral  Oral  SpO2: 96% 96% 96% 98%    Weight:      Height:        Intake/Output Summary (Last 24 hours) at 05/11/2019 2230 Last data filed at 05/11/2019 1800 Gross per 24 hour  Intake 1282.64 ml  Output --  Net 1282.64 ml   Filed Weights   05/10/19 1150  Weight: 63.5 kg    Examination:  General exam: Appears calm and comfortable  Respiratory system: Clear to auscultation. Respiratory effort normal. Cardiovascular system: S1 & S2 heard, . No bradycardic. JVD, murmurs, rubs, gallops or clicks. No pedal edema. Gastrointestinal system: Abdomen is nondistended, soft and nontender. No organomegaly or masses felt. Normal bowel sounds heard. Central nervous system: Alert and oriented. No focal neurological deficits. Extremities: Symmetric 5 x 5 power. Skin: No rashes, lesions or ulcers Psychiatry: Judgement and insight appear normal. Mood & affect appropriate.     Data Reviewed: I have personally reviewed following labs and imaging studies  CBC: Recent Labs  Lab 05/10/19 1210 05/11/19 0519  WBC 4.9 3.9*  NEUTROABS 3.2  --   HGB 13.2 12.3*  HCT 40.3 37.9*  MCV 97.1 96.2  PLT 263 [id number]   Basic Metabolic Panel: Recent Labs  Lab 05/10/19 1210 05/11/19 0519  NA 136 140  K 3.7 3.9  CL 104 109  CO2 25 26  GLUCOSE 123* 91  BUN 10 13  CREATININE 0.90 1.05  CALCIUM 8.8* 8.4*   GFR: Estimated Creatinine Clearance: 63.8 mL/min (by C-G  formula based on SCr of 1.05 mg/dL). Liver Function Tests: Recent Labs  Lab 05/10/19 1210  AST 40  ALT 32  ALKPHOS 52  BILITOT 0.6  PROT 6.7  ALBUMIN 3.8   No results for input(s): LIPASE, AMYLASE in the last 168 hours. No results for input(s): AMMONIA in the last 168 hours. Coagulation Profile: No results for input(s): INR, PROTIME in the last 168 hours. Cardiac Enzymes: No results for input(s): CKTOTAL, CKMB, CKMBINDEX, TROPONINI in the last 168 hours. BNP (last 3 results) No results for input(s): PROBNP in the last 8760 hours. HbA1C: No results for input(s): HGBA1C  in the last 72 hours. CBG: No results for input(s): GLUCAP in the last 168 hours. Lipid Profile: No results for input(s): CHOL, HDL, LDLCALC, TRIG, CHOLHDL, LDLDIRECT in the last 72 hours. Thyroid Function Tests: Recent Labs    05/11/19 0519  TSH 5.948*   Anemia Panel: No results for input(s): VITAMINB12, FOLATE, FERRITIN, TIBC, IRON, RETICCTPCT in the last 72 hours. Sepsis Labs: No results for input(s): PROCALCITON, LATICACIDVEN in the last 168 hours.  Recent Results (from the past 240 hour(s))  SARS CORONAVIRUS 2 (TAT 6-24 HRS) Nasopharyngeal Nasopharyngeal Swab     Status: None   Collection Time: 05/10/19  1:30 PM   Specimen: Nasopharyngeal Swab  Result Value Ref Range Status   SARS Coronavirus 2 NEGATIVE NEGATIVE Final    Comment: (NOTE) SARS-CoV-2 target nucleic acids are NOT DETECTED. The SARS-CoV-2 RNA is generally detectable in upper and lower respiratory specimens during the acute phase of infection. Negative results do not preclude SARS-CoV-2 infection, do not rule out co-infections with other pathogens, and should not be used as the sole basis for treatment or other patient management decisions. Negative results must be combined with clinical observations, patient history, and epidemiological information. The expected result is Negative. Fact Sheet for Patients: SugarRoll.be Fact Sheet for Healthcare Providers: https://www.woods-mathews.com/ This test is not yet approved or cleared by the [place] FDA and  has been authorized for detection and/or diagnosis of SARS-CoV-2 by FDA under an Emergency Use Authorization (EUA). This EUA will remain  in effect (meaning this test can be used) for the duration of the COVID-19 declaration under Section 56 4(b)(1) of the Act, 21 U.S.C. section 360bbb-3(b)(1), unless the authorization is terminated or revoked sooner. Performed at [facility], [place] [address], [place],  [place] [address]          Radiology Studies: DG Chest 1 View  Result Date: 05/10/2019 CLINICAL DATA:  Bradycardia and dizziness. EXAM: CHEST  1 VIEW COMPARISON:  11/20/2018 FINDINGS: The cardiac silhouette, mediastinal and hilar contours are within normal limits and stable. The lungs are clear. No pleural effusions or pneumothorax. No worrisome pulmonary lesions. The bony thorax is intact. IMPRESSION: No acute cardiopulmonary findings. Electronically Signed   By: Marijo Sanes M.D.   On: 05/10/2019 12:55   CT Head Wo Contrast  Result Date: 05/10/2019 CLINICAL DATA:  Ataxia. Stroke suspected. EXAM: CT HEAD WITHOUT CONTRAST TECHNIQUE: Contiguous axial images were obtained from the base of the skull through the vertex without intravenous contrast. COMPARISON:  August 29, 2001 FINDINGS: Brain: No evidence of acute infarction, hemorrhage, hydrocephalus, extra-axial collection or mass lesion/mass effect. Vascular: No hyperdense vessel or unexpected calcification. Skull: Normal. Negative for fracture or focal lesion. Sinuses/Orbits: No acute finding. Other: None. IMPRESSION: No acute intracranial abnormality. Electronically Signed   By: Fidela Salisbury M.D.   On: 05/10/2019 12:46  Scheduled Meds: . aspirin EC  81 mg Oral Daily  . enoxaparin (LOVENOX) injection  40 mg Subcutaneous Q24H  . pantoprazole  40 mg Oral BID   Continuous Infusions: . sodium chloride 75 mL/hr at 05/11/19 1915     LOS: 0 days    Time spent: 107mins    Kathie Dike, MD [facility]   If 7PM-7AM, please contact night-coverage [email or web address]  05/11/2019, 10:30 PM

## 2019-05-12 DIAGNOSIS — K2100 Gastro-esophageal reflux disease with esophagitis, without bleeding: Secondary | ICD-10-CM | POA: Diagnosis present

## 2019-05-12 DIAGNOSIS — Z91030 Bee allergy status: Secondary | ICD-10-CM | POA: Diagnosis not present

## 2019-05-12 DIAGNOSIS — I480 Paroxysmal atrial fibrillation: Secondary | ICD-10-CM | POA: Diagnosis present

## 2019-05-12 DIAGNOSIS — F17210 Nicotine dependence, cigarettes, uncomplicated: Secondary | ICD-10-CM | POA: Diagnosis present

## 2019-05-12 DIAGNOSIS — I4819 Other persistent atrial fibrillation: Secondary | ICD-10-CM | POA: Diagnosis not present

## 2019-05-12 DIAGNOSIS — Z79899 Other long term (current) drug therapy: Secondary | ICD-10-CM | POA: Diagnosis not present

## 2019-05-12 DIAGNOSIS — I429 Cardiomyopathy, unspecified: Secondary | ICD-10-CM | POA: Diagnosis present

## 2019-05-12 DIAGNOSIS — Z8249 Family history of ischemic heart disease and other diseases of the circulatory system: Secondary | ICD-10-CM | POA: Diagnosis not present

## 2019-05-12 DIAGNOSIS — R001 Bradycardia, unspecified: Secondary | ICD-10-CM | POA: Diagnosis present

## 2019-05-12 DIAGNOSIS — R42 Dizziness and giddiness: Secondary | ICD-10-CM | POA: Diagnosis present

## 2019-05-12 DIAGNOSIS — Z87892 Personal history of anaphylaxis: Secondary | ICD-10-CM | POA: Diagnosis not present

## 2019-05-12 DIAGNOSIS — Z20822 Contact with and (suspected) exposure to covid-19: Secondary | ICD-10-CM | POA: Diagnosis present

## 2019-05-12 DIAGNOSIS — Z720 Tobacco use: Secondary | ICD-10-CM | POA: Diagnosis not present

## 2019-05-12 LAB — GLUCOSE, CAPILLARY: Glucose-Capillary: 80 mg/dL (ref 70–99)

## 2019-05-12 LAB — T4, FREE: Free T4: 0.95 ng/dL (ref 0.61–1.12)

## 2019-05-12 MED ORDER — DILTIAZEM HCL 25 MG/5ML IV SOLN
10.0000 mg | Freq: Once | INTRAVENOUS | Status: AC
  Administered 2019-05-12: 10 mg via INTRAVENOUS
  Filled 2019-05-12: qty 5

## 2019-05-12 MED ORDER — NICOTINE 21 MG/24HR TD PT24
21.0000 mg | MEDICATED_PATCH | Freq: Every day | TRANSDERMAL | Status: DC
  Administered 2019-05-12 – 2019-05-13 (×2): 21 mg via TRANSDERMAL
  Filled 2019-05-12 (×2): qty 1

## 2019-05-12 MED ORDER — DILTIAZEM HCL 60 MG PO TABS
60.0000 mg | ORAL_TABLET | ORAL | Status: AC
  Administered 2019-05-12: 60 mg via ORAL
  Filled 2019-05-12: qty 1

## 2019-05-12 MED ORDER — DILTIAZEM HCL ER COATED BEADS 120 MG PO CP24
120.0000 mg | ORAL_CAPSULE | Freq: Every day | ORAL | Status: DC
  Administered 2019-05-12 – 2019-05-13 (×2): 120 mg via ORAL
  Filled 2019-05-12 (×2): qty 1

## 2019-05-12 NOTE — Progress Notes (Addendum)
Notified by Tele that patient has converted into Afib with rate in the 110s.  Provider notified.  Provider will order Cardizem and patient will be monitored for several hours.  Cardizem given.  See MAR.

## 2019-05-12 NOTE — Progress Notes (Signed)
PROGRESS NOTE    Kent Davidson  [id number] DOB: 03-21-1955 DOA: 05/10/2019 PCP: System, Pcp Not In    Brief Narrative:  64 y/o male with a history of A fib on cardizem was admitted to the hospital with symptomatic bradycardia. Patient had heart rate in low 40s and was becoming lightheaded on ambulation/exertion. Diltiazem held on admission and he is being monitored for improvement. If heart rate does not improve, can consider cardiology consult   Assessment & Plan:   Active Problems:   Atrial fibrillation (HCC)   Gastroesophageal reflux disease with esophagitis without hemorrhage   Symptomatic bradycardia   1. Symptomatic bradycardia.  Currently, this appears to be the most likely cause of patient's symptoms.  The patient does not have any symptoms while sitting/laying down.  It only occurs when he exerts himself.  He is likely not able to mount an appropriate chronotropic response to exertion.  He is chronically on diltiazem which has been held.  Orthostatics were checked in the emergency room and found to be negative.  Since admission, heart rate has improved overall symptoms have resolved.  He is able to ambulate without difficulty at this time. 2. Atrial fibrillation.  CHADSVASc score of 1.  He has been monitored on aspirin.  Since his Cardizem was held on admission, he unfortunately went into rapid atrial fibrillation.  He has been restarted on lower dose of Cardizem and will need to be monitored overnight to make sure his heart rate is stable. 3. GERD.  Continue PPI.   DVT prophylaxis: lovenox Code Status: full code Family Communication: none present Disposition Plan: discharge home once patient heart rate is stabilized   Consultants:     Procedures:     Antimicrobials:       Subjective: Patient is feeling better today.  He is able to ambulate without dizziness.  Initially, heart rate was still in the 40s, but improved to the 50s on ambulation.   Subsequently, it was reported on telemetry that he had gone into rapid atrial fibrillation with a heart rate in the 120s to 130s.  Objective: Vitals:   05/12/19 0737 05/12/19 1304 05/12/19 1319 05/12/19 1759  BP:  122/78 117/86 111/71  Pulse:  (!) 104 98   Resp:   17   Temp:   98 F (36.7 C)   TempSrc:   Oral   SpO2: 96%  95%   Weight:      Height:        Intake/Output Summary (Last 24 hours) at 05/12/2019 1917 Last data filed at 05/12/2019 1700 Gross per 24 hour  Intake 2650.92 ml  Output --  Net 2650.92 ml   Filed Weights   05/10/19 1150  Weight: 63.5 kg    Examination:  General exam: Alert, awake, oriented x 3 Respiratory system: Clear to auscultation. Respiratory effort normal. Cardiovascular system:RRR. No murmurs, rubs, gallops. Gastrointestinal system: Abdomen is nondistended, soft and nontender. No organomegaly or masses felt. Normal bowel sounds heard. Central nervous system: Alert and oriented. No focal neurological deficits. Extremities: No C/C/E, +pedal pulses Skin: No rashes, lesions or ulcers Psychiatry: Judgement and insight appear normal. Mood & affect appropriate.    Data Reviewed: I have personally reviewed following labs and imaging studies  CBC: Recent Labs  Lab 05/10/19 1210 05/11/19 0519  WBC 4.9 3.9*  NEUTROABS 3.2  --   HGB 13.2 12.3*  HCT 40.3 37.9*  MCV 97.1 96.2  PLT 263 [id number]   Basic Metabolic Panel: Recent Labs  Lab  05/10/19 1210 05/11/19 0519  NA 136 140  K 3.7 3.9  CL 104 109  CO2 25 26  GLUCOSE 123* 91  BUN 10 13  CREATININE 0.90 1.05  CALCIUM 8.8* 8.4*   GFR: Estimated Creatinine Clearance: 63.8 mL/min (by C-G formula based on SCr of 1.05 mg/dL). Liver Function Tests: Recent Labs  Lab 05/10/19 1210  AST 40  ALT 32  ALKPHOS 52  BILITOT 0.6  PROT 6.7  ALBUMIN 3.8   No results for input(s): LIPASE, AMYLASE in the last 168 hours. No results for input(s): AMMONIA in the last 168 hours. Coagulation Profile: No  results for input(s): INR, PROTIME in the last 168 hours. Cardiac Enzymes: No results for input(s): CKTOTAL, CKMB, CKMBINDEX, TROPONINI in the last 168 hours. BNP (last 3 results) No results for input(s): PROBNP in the last 8760 hours. HbA1C: No results for input(s): HGBA1C in the last 72 hours. CBG: Recent Labs  Lab 05/12/19 1617  GLUCAP 80   Lipid Profile: No results for input(s): CHOL, HDL, LDLCALC, TRIG, CHOLHDL, LDLDIRECT in the last 72 hours. Thyroid Function Tests: Recent Labs    05/11/19 0519  TSH 5.948*  FREET4 0.95   Anemia Panel: No results for input(s): VITAMINB12, FOLATE, FERRITIN, TIBC, IRON, RETICCTPCT in the last 72 hours. Sepsis Labs: No results for input(s): PROCALCITON, LATICACIDVEN in the last 168 hours.  Recent Results (from the past 240 hour(s))  SARS CORONAVIRUS 2 (TAT 6-24 HRS) Nasopharyngeal Nasopharyngeal Swab     Status: None   Collection Time: 05/10/19  1:30 PM   Specimen: Nasopharyngeal Swab  Result Value Ref Range Status   SARS Coronavirus 2 NEGATIVE NEGATIVE Final    Comment: (NOTE) SARS-CoV-2 target nucleic acids are NOT DETECTED. The SARS-CoV-2 RNA is generally detectable in upper and lower respiratory specimens during the acute phase of infection. Negative results do not preclude SARS-CoV-2 infection, do not rule out co-infections with other pathogens, and should not be used as the sole basis for treatment or other patient management decisions. Negative results must be combined with clinical observations, patient history, and epidemiological information. The expected result is Negative. Fact Sheet for Patients: SugarRoll.be Fact Sheet for Healthcare Providers: https://www.woods-mathews.com/ This test is not yet approved or cleared by the [place] FDA and  has been authorized for detection and/or diagnosis of SARS-CoV-2 by FDA under an Emergency Use Authorization (EUA). This EUA will remain    in effect (meaning this test can be used) for the duration of the COVID-19 declaration under Section 56 4(b)(1) of the Act, 21 U.S.C. section 360bbb-3(b)(1), unless the authorization is terminated or revoked sooner. Performed at [facility], [place] [address] [place], [place] [address]          Radiology Studies: No results found.      Scheduled Meds: . aspirin EC  81 mg Oral Daily  . diltiazem  120 mg Oral Daily  . enoxaparin (LOVENOX) injection  40 mg Subcutaneous Q24H  . pantoprazole  40 mg Oral BID   Continuous Infusions: . sodium chloride 75 mL/hr at 05/12/19 0603     LOS: 0 days    Time spent: 27mins    Kathie Dike, MD [facility]   If 7PM-7AM, please contact night-coverage [email or web address]  05/12/2019, 7:17 PM

## 2019-05-12 NOTE — Progress Notes (Signed)
Patient has been bradycardic for entirety of shift so far.  Heart rate has been in the 40s, at times in the high 30s.  Patient has not complained of chest pain.

## 2019-05-12 NOTE — Progress Notes (Signed)
Patient ambulated in the hall.  His HR ranged from the upper 40s to lower 50s.  Patient was not dizzy while he ambulated.   Provider notified.

## 2019-05-13 DIAGNOSIS — Z720 Tobacco use: Secondary | ICD-10-CM

## 2019-05-13 DIAGNOSIS — I480 Paroxysmal atrial fibrillation: Secondary | ICD-10-CM

## 2019-05-13 LAB — BASIC METABOLIC PANEL
Anion gap: 4 — ABNORMAL LOW (ref 5–15)
BUN: 14 mg/dL (ref 8–23)
CO2: 28 mmol/L (ref 22–32)
Calcium: 8.2 mg/dL — ABNORMAL LOW (ref 8.9–10.3)
Chloride: 107 mmol/L (ref 98–111)
Creatinine, Ser: 0.96 mg/dL (ref 0.61–1.24)
GFR calc Af Amer: >60 mL/min (ref >60)
GFR calc non Af Amer: >60 mL/min (ref >60)
Glucose, Bld: 84 mg/dL (ref 70–99)
Potassium: 3.7 mmol/L (ref 3.5–5.1)
Sodium: 139 mmol/L (ref 135–145)

## 2019-05-13 LAB — T3: T3, Total: 83 ng/dL (ref 71–180)

## 2019-05-13 LAB — CBC
HCT: 43.2 % (ref 39.0–52.0)
Hemoglobin: 14.3 g/dL (ref 13.0–17.0)
MCH: 32.1 pg (ref 26.0–34.0)
MCHC: 33.1 g/dL (ref 30.0–36.0)
MCV: 96.9 fL (ref 80.0–100.0)
Platelets: 247 10*3/uL (ref 150–400)
RBC: 4.46 MIL/uL (ref 4.22–5.81)
RDW: 14 % (ref 11.5–15.5)
WBC: 4.4 10*3/uL (ref 4.0–10.5)
nRBC: 0 % (ref 0.0–0.2)

## 2019-05-13 LAB — MAGNESIUM: Magnesium: 2 mg/dL (ref 1.7–2.4)

## 2019-05-13 MED ORDER — DILTIAZEM HCL ER COATED BEADS 120 MG PO CP24: 120.0000 mg | ORAL_CAPSULE | Freq: Every day | ORAL | 1 refills | Status: DC

## 2019-05-13 NOTE — Progress Notes (Signed)
Pt discharged to POV via wheelchair accompanied by staff. Pt with all belongings in his care.

## 2019-05-13 NOTE — Consult Note (Addendum)
Cardiology Consult    Patient ID: Kent Davidson; [id number]; 1955-09-01   Admit date: 05/10/2019 Date of Consult: 05/13/2019  Primary Care Provider: System, Pcp Not In Primary Cardiologist: [name], MD   Patient Profile    Kent Davidson is a 64 y.o. male with past medical history of paroxysmal atrial fibrillation, history of cardiomyopathy (EF previously 45% in 11/2018, improved to 55-60% by repeat echo in 02/2019) and tobacco use who is being seen today for the evaluation of bradycardia at the request of Dr. Carles Collet.   History of Present Illness    Mr. Cuartas was last evaluated by Dr. Domenic Polite in 02/2019 and denied any recent chest pain or palpitations at that time. Given his CHA2DS2-VASc Score of 1, he was not started on anticoagulation and was continued on ASA 81mg  daily and Cardizem CD 240mg  daily.   He presented to [facility] ED on 05/10/2019 for evaluation of worsening dizziness for the past month. He reports having intermittent episodes of dizziness which can occur at rest or with activity and reports feeling "swimmy headed". Initially thought it was related to his right ear. He does report occasional presyncope but denies any actual syncopal events. No recent chest pain or palpitations. He is unaware of his episodes of atrial fibrillation. No recent orthopnea, PND or lower extremity edema. 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  Upon arrival to the ED, he was found to be bradycardic with heart rate in the 40's. Initial EKG showed sinus bradycardia, heart rate 44 with nonspecific IVCD.  Initial labs showed WBC 4.9, Hgb 13.2, platelets 263, Na+ 136, K+ 3.7 and creatinine 0.90. Initial HS Troponin 5. TSH elevated to 5.948 with repeat T3 and T4 WNL. CT Head showed no acute intracranial abnormalities. CXR with no acute cardiopulmonary findings.  His episodic dizziness was thought to  be secondary to symptomatic bradycardia and Cardizem CD was held on admission. Orthostatics were also checked and found to be negative. Yesterday afternoon, he was found to have been in atrial fibrillation with RVR by review of telemetry with heart rate in the 110's to 120's.  He received IV Cardizem 10 mg followed by short acting 60 mg tablet. Was also restarted on long-acting Cardizem CD 120 mg daily. Overnight, he has been in atrial fibrillation with rates in the 50's to 60's. He does have occasional pauses up to 2.5 seconds but nothing greater than 3 seconds.  He reports feeling back to baseline at this time and is very anxious to go home.  Past Medical History:  Diagnosis Date  . Atrial fibrillation [facility])    Documented October 2020  . Esophageal obstruction due to food impaction    October 2020  . Gastroesophageal reflux     Past Surgical History:  Procedure Laterality Date  . APPENDECTOMY    . ESOPHAGEAL DILATION  11/19/2018   Procedure: ESOPHAGEAL DILATION;  Surgeon: Rogene Houston, MD;  Location: [facility];  Service: Endoscopy;;  . ESOPHAGOGASTRODUODENOSCOPY N/A 11/19/2018   Procedure: ESOPHAGOGASTRODUODENOSCOPY (EGD);  Surgeon: Rogene [place], MD;  Location: [facility] END[facility]ITE;  Service: Endoscopy;  Laterality: N/A;  . I & D EXTREMITY Right 10/03/2018   Procedure: IRRIGATION AND DEBRIDEMENT  RIGHT ULNA;  Surgeon: Carole Civil, MD;  Location: [facility] ORS;  Service: Orthopedics;  Laterality: Right;  . ORIF ULNAR FRACTURE Right 10/03/2018   Procedure: OPEN  REDUCTION INTERNAL FIXATION (ORIF) RIGHT ULNAR FRACTURE;  Surgeon: Carole Civil, MD;  Location: [facility] ORS;  Service: Orthopedics;  Laterality: Right;  . TENDON REPAIR  11/13/2011   Procedure: TENDON REPAIR;  Surgeon: Tennis Must, MD;  Location: [place];  Service: Orthopedics;  Laterality: Left;  incision and drainage with percutaneous pinning left index finger      Home Medications:  Prior to Admission  medications   Medication Sig Start Date End Date Taking? Authorizing Provider  aspirin EC 81 MG tablet Take 1 tablet (81 mg total) by mouth daily. 12/19/18  Yes Satira Sark, MD  diltiazem (CARTIA XT) 240 MG 24 hr capsule Take 1 capsule (240 mg total) by mouth daily. 11/21/18 11/21/19 Yes Barton Dubois, MD  pantoprazole (PROTONIX) 40 MG tablet Take 1 tablet (40 mg total) by mouth 2 (two) times daily. 11/21/18  Yes Barton Dubois, MD    Inpatient Medications: Scheduled Meds: . aspirin EC  81 mg Oral Daily  . diltiazem  120 mg Oral Daily  . enoxaparin (LOVENOX) injection  40 mg Subcutaneous Q24H  . nicotine  21 mg Transdermal Daily  . pantoprazole  40 mg Oral BID   Continuous Infusions: . sodium chloride 75 mL/hr at 05/12/19 0603   PRN Meds: acetaminophen **OR** acetaminophen, ondansetron **OR** ondansetron (ZOFRAN) IV  Allergies:    Allergies  Allergen Reactions  . Bee Venom Anaphylaxis    Social History:   Social History   Socioeconomic History  . Marital status: Divorced    Spouse name: Not on file  . Number of children: Not on file  . Years of education: Not on file  . Highest education level: Not on file  Occupational History  . Not on file  Tobacco Use  . Smoking status: Current Every Day Smoker    Packs/day: 0.50    Types: Cigarettes  . Smokeless tobacco: Current User    Types: Snuff  Substance and Sexual Activity  . Alcohol use: No  . Drug use: No  . Sexual activity: Not on file  Other Topics Concern  . Not on file  Social History Narrative  . Not on file   Social Determinants of Health   Financial Resource Strain:   . Difficulty of Paying Living Expenses:   Food Insecurity:   . Worried About Charity fundraiser in the Last Year:   . Arboriculturist in the Last Year:   Transportation Needs:   . Film/video editor (Medical):   Marland Kitchen Lack of Transportation (Non-Medical):   Physical Activity:   . Days of Exercise per Week:   . Minutes of  Exercise per Session:   Stress:   . Feeling of Stress :   Social Connections:   . Frequency of Communication with Friends and Family:   . Frequency of Social Gatherings with Friends and Family:   . Attends Religious Services:   . Active Member of Clubs or Organizations:   . Attends Archivist Meetings:   Marland Kitchen Marital Status:   Intimate Partner Violence:   . Fear of Current or Ex-Partner:   . Emotionally Abused:   Marland Kitchen Physically Abused:   . Sexually Abused:      Family History:    Family History  Problem Relation Age of Onset  . Diabetes Mother   . Hypertension Mother   . Heart attack Mother       Review of Systems    General:  No chills, fever,  night sweats or weight changes. Positive for dizziness.  Cardiovascular:  No chest pain, dyspnea on exertion, edema, orthopnea, palpitations, paroxysmal nocturnal dyspnea. Dermatological: No rash, lesions/masses Respiratory: No cough, dyspnea Urologic: No hematuria, dysuria Abdominal:   No nausea, vomiting, diarrhea, bright red blood per rectum, melena, or hematemesis Neurologic:  No visual changes, wkns, changes in mental status.  All other systems reviewed and are otherwise negative except as noted above.  Physical Exam/Data    Vitals:   05/12/19 1759 05/12/19 1947 05/12/19 2050 05/13/19 0500  BP: 111/71  95/65 114/77  Pulse:   73 (!) 55  Resp:   16 16  Temp:   98.5 F (36.9 C) 98 F (36.7 C)  TempSrc:   Oral Oral  SpO2:  94% 96% 100%  Weight:      Height:        Intake/Output Summary (Last 24 hours) at 05/13/2019 0900 Last data filed at 05/12/2019 1700 Gross per 24 hour  Intake 2410.92 ml  Output --  Net 2410.92 ml   Filed Weights   05/10/19 1150  Weight: 63.5 kg   Body mass index is 19.53 kg/m.   General: Pleasant male appearing in NAD Psych: Normal affect. Neuro: Alert and oriented X 3. Moves all extremities spontaneously. HEENT: Normal  Neck: Supple without bruits or JVD. Lungs:  Resp regular and  unlabored, CTA without wheezing or rales. Heart: Irregularly irregular no s3, s4, or murmurs. Abdomen: Soft, non-tender, non-distended, BS + x 4.  Extremities: No clubbing, cyanosis or lower extremity edema. DP/PT/Radials 2+ and equal bilaterally.   EKG:  The EKG was personally reviewed and demonstrates: Sinus bradycardia, heart rate 44 with nonspecific IVCD.  Telemetry:  Telemetry was personally reviewed and demonstrates: As above.    Labs/Studies     Relevant CV Studies:  Echocardiogram: 02/2019 IMPRESSIONS    1. Left ventricular ejection fraction, by visual estimation, is 55 to  60%. The left ventricle has normal function. There is no left ventricular  hypertrophy.  2. The left ventricle has no regional wall motion abnormalities.  3. Global right ventricle has normal systolic function.The right  ventricular size is normal. No increase in right ventricular wall  thickness.  4. Left atrial size was moderately dilated.  5. Right atrial size was severely dilated.  6. The mitral valve is grossly normal. Trivial mitral valve  regurgitation.  7. The tricuspid valve is grossly normal.  8. The aortic valve is tricuspid. Aortic valve regurgitation is not  visualized.  9. The pulmonic valve was not well visualized. Pulmonic valve  regurgitation is trivial.  10. Aortic dilatation noted.  11. There is borderline dilatation of the aortic root.  12. Normal pulmonary artery systolic pressure.  13. The tricuspid regurgitant velocity is 2.38 m/s, and with an assumed  right atrial pressure of 3 mmHg, the estimated right ventricular systolic  pressure is normal at 25.7 mmHg.  14. The inferior vena cava is normal in size with greater than 50%  respiratory variability, suggesting right atrial pressure of 3 mmHg.   Laboratory Data:  Chemistry Recent Labs  Lab 05/10/19 1210 05/11/19 0519 05/13/19 0718  NA 136 140 139  K 3.7 3.9 3.7  CL 104 109 107  CO2 25 26 28   GLUCOSE  123* 91 84  BUN 10 13 14   CREATININE 0.90 1.05 0.96  CALCIUM 8.8* 8.4* 8.2*  GFRNONAA >60 >60 >60  GFRAA >60 >60 >60  ANIONGAP 7 5 4*    Recent Labs  Lab  05/10/19 1210  PROT 6.7  ALBUMIN 3.8  AST 40  ALT 32  ALKPHOS 52  BILITOT 0.6   Hematology Recent Labs  Lab 05/10/19 1210 05/11/19 0519 05/13/19 0718  WBC 4.9 3.9* 4.4  RBC 4.15* 3.94* 4.46  HGB 13.2 12.3* 14.3  HCT 40.3 37.9* 43.2  MCV 97.1 96.2 96.9  MCH 31.8 31.2 32.1  MCHC 32.8 32.5 33.1  RDW 14.0 14.1 14.0  PLT 263 234 247   Cardiac EnzymesNo results for input(s): TROPONINI in the last 168 hours. No results for input(s): TROPIPOC in the last 168 hours.  BNPNo results for input(s): BNP, PROBNP in the last 168 hours.  DDimer No results for input(s): DDIMER in the last 168 hours.  Radiology/Studies:  DG Chest 1 View  Result Date: 05/10/2019 CLINICAL DATA:  Bradycardia and dizziness. EXAM: CHEST  1 VIEW COMPARISON:  11/20/2018 FINDINGS: The cardiac silhouette, mediastinal and hilar contours are within normal limits and stable. The lungs are clear. No pleural effusions or pneumothorax. No worrisome pulmonary lesions. The bony thorax is intact. IMPRESSION: No acute cardiopulmonary findings. Electronically Signed   By: Marijo Sanes M.D.   On: 05/10/2019 12:55   CT Head Wo Contrast  Result Date: 05/10/2019 CLINICAL DATA:  Ataxia. Stroke suspected. EXAM: CT HEAD WITHOUT CONTRAST TECHNIQUE: Contiguous axial images were obtained from the base of the skull through the vertex without intravenous contrast. COMPARISON:  August 29, 2001 FINDINGS: Brain: No evidence of acute infarction, hemorrhage, hydrocephalus, extra-axial collection or mass lesion/mass effect. Vascular: No hyperdense vessel or unexpected calcification. Skull: Normal. Negative for fracture or focal lesion. Sinuses/Orbits: No acute finding. Other: None. IMPRESSION: No acute intracranial abnormality. Electronically Signed   By: Fidela Salisbury M.D.   On: 05/10/2019  12:46     Assessment & Plan    1. Paroxysmal Atrial Fibrillation with Episodes of Symptomatic Bradycardia - He has a known history of paroxysmal atrial fibrillation but was found to be significantly bradycardic upon admission with heart rate in the 40's and in normal sinus rhythm at that time. Cardizem CD was held and he developed atrial fibrillation with RVR yesterday afternoon. Electrolytes WNL. TSH elevated but follow-up labs WNL.  - He is asymptomatic at this time and is anxious to go home. While he is still in atrial fibrillation, heart rates have been in the 50's to 60's overnight. Agree with reducing his PTA Cardizem CD dosing from 240 mg daily to 120 mg daily.  Anticipate he would benefit from a 2-week cardiac event monitor to assess for any significant pauses or recurrent bradycardia given his medication adjustments. We reviewed that given his tachy-brady syndrome, he may ultimately require PPM placement if he continues to have frequent episodes with associated dizziness.  2. History of Cardiomyopathy - EF previously 45% in 11/2018, improved to 55-60% by repeat echo in 02/2019. He denies any recent orthopnea, PND or edema. Appears euvolemic by examination.   3. Tobacco Use - smokes 0.5 ppd. Cessation advised. Nicotine patch currently in place.    For questions or updates, please contact [place] Please consult [email or web address] for contact info under Cardiology/STEMI.  Signed, Erma Heritage, PA-C 05/13/2019, 9:00 AM Pager: [phone]   Attending note  Patient seen and discussed with PA Ahmed Prima, I agree with her documentation above. 64 yo male history of afib not on anticoag due to low CHADS2Vasc score and prior LV dysfunction that normalized presents with dizziness. Found to be in sinus brady in mid 40s on presentation.   Admit  labs WBC 4.9 Hgb 13.2 Plt 263 K 3.7 Cr 0.9 TSH 5.9 Free T4 0.95 hstrop 5--> COVID neg CT head no acute process CXR no acute process EKG  sinus brady 44 Jan 2021 echo LVEF 55-60%,   Patient admitted with dizziness. On admission sinus brady to mid 56s. Was on dilt at home 240mg , held on admissoin. Orthostatics negative. Off cardizem went into afib with RVR, restarted on dilt at lower dose of 120mg  daily. He has paroxysmal afib in general with signs of tachy brady syndrome. Rates look good on lower dilt dosing, would contiue at this time and plan outpatient 2 week monitor. May need considerations for EP evaluation if ongoing issues with tachy brady, perhaps trial of antiarrhythmic therapy vs pacemaker if fails rate control going forward.   [place] for discharge today, we will arrange outpatient 2 week event monitor and 3 week f/u. We will sign off inpatient care.   Carlyle Dolly MD

## 2019-05-13 NOTE — Discharge Summary (Signed)
Physician Discharge Summary  Kent Davidson [id number] DOB: Jun 21, 1955 DOA: 05/10/2019  PCP: System, Pcp Not In  Admit date: 05/10/2019 Discharge date: 05/13/2019  Admitted From: Home Disposition:  Home   Recommendations for Outpatient Follow-up:  1. Follow up with PCP in 1-2 weeks 2. Please obtain BMP/CBC in one week     Discharge Condition: Stable CODE STATUS: FULL Diet recommendation: Heart Healthy    Brief/Interim Summary: 64 y.o. male with past medical history of paroxysmal atrial fibrillation, history of cardiomyopathy (EF previously 45% in 11/2018, improved to 55-60% by repeat echo in 02/2019) and tobacco use admitted to the hospital with symptomatic bradycardia. Patient had heart rate in low 40s and was becoming lightheaded on ambulation/exertion. Diltiazem held on admission and he is being monitored for improvement.   Upon arrival to the ED, he was found to be bradycardic with heart rate in the 40's. Initial EKG showed sinus bradycardia, heart rate 44 with nonspecific IVCD.  Initial labs showed WBC 4.9, Hgb 13.2, platelets 263, Na+ 136, K+ 3.7 and creatinine 0.90. Initial HS Troponin 5. TSH elevated to 5.948 with repeat T3 and T4 WNL. CT Head showed no acute intracranial abnormalities. CXR with no acute cardiopulmonary findings.  Orthostatics were also checked and found to be negative. 05/12/19 afternoon, he was found to have been in atrial fibrillation with RVR by review of telemetry with heart rate in the 110's to 120's.  He received IV Cardizem 10 mg followed by short acting 60 mg tablet. Was also restarted on long-acting Cardizem CD 120 mg daily. Overnight, he has been in atrial fibrillation with rates in the 50's to 60's. He does have occasional pauses up to 2.5 seconds but nothing greater than 3 seconds.  Cardiology was consulted.  They cleared the patient for discharge with plans to set up outpt event monitor.  Discharge Diagnoses:   1. Symptomatic bradycardia.  Currently, this appears to be the most likely cause of patient's symptoms. The patient does not have any symptoms while sitting/laying down. It only occurs when he exerts himself. He is likely not able to mount an appropriate chronotropic response to exertion. He is chronically on diltiazem which has been held. Orthostatics were checked in the emergency room and found to be negative.  Since admission, heart rate has improved overall symptoms have resolved.  He is able to ambulate without difficulty at this time. 2. Paroxysmal Atrial fibrillation with RVR. CHADSVAScscore of 1. He has been monitored on aspirin.  Since his Cardizem was held on admission, he unfortunately went into rapid atrial fibrillation.  He has been restarted on lower dose of Cardizem CD (240mg --->120mg ).  His HR remained in 50-60s range.  Cardiology was consulted and cleared patient for d/c with plans to set up outpt event monitor 3. GERD. Continue PPI. 4. Tobacco Abuse--cessation discussed  Discharge Instructions   Allergies as of 05/13/2019      Reactions   Bee Venom Anaphylaxis      Medication List    TAKE these medications   aspirin EC 81 MG tablet Take 1 tablet (81 mg total) by mouth daily.   diltiazem 120 MG 24 hr capsule Commonly known as: CARDIZEM CD Take 1 capsule (120 mg total) by mouth daily. Start taking on: May 14, 2019 What changed:   medication strength  how much to take   pantoprazole 40 MG tablet Commonly known as: PROTONIX Take 1 tablet (40 mg total) by mouth 2 (two) times daily.      Follow-up Information  Satira Sark, MD Follow up.   Specialty: Cardiology Why: The office will contact you to arrange a cardiac event monitor to wear prior to your appointment. Will follow-up with Dr. Domenic Polite on 06/12/2019 at 1:00 PM.  Contact information: [place] [address] [phone]          Allergies  Allergen Reactions  . Bee Venom Anaphylaxis     Consultations:  cardiology   Procedures/Studies: DG Chest 1 View  Result Date: 05/10/2019 CLINICAL DATA:  Bradycardia and dizziness. EXAM: CHEST  1 VIEW COMPARISON:  11/20/2018 FINDINGS: The cardiac silhouette, mediastinal and hilar contours are within normal limits and stable. The lungs are clear. No pleural effusions or pneumothorax. No worrisome pulmonary lesions. The bony thorax is intact. IMPRESSION: No acute cardiopulmonary findings. Electronically Signed   By: Marijo Sanes M.D.   On: 05/10/2019 12:55   CT Head Wo Contrast  Result Date: 05/10/2019 CLINICAL DATA:  Ataxia. Stroke suspected. EXAM: CT HEAD WITHOUT CONTRAST TECHNIQUE: Contiguous axial images were obtained from the base of the skull through the vertex without intravenous contrast. COMPARISON:  August 29, 2001 FINDINGS: Brain: No evidence of acute infarction, hemorrhage, hydrocephalus, extra-axial collection or mass lesion/mass effect. Vascular: No hyperdense vessel or unexpected calcification. Skull: Normal. Negative for fracture or focal lesion. Sinuses/Orbits: No acute finding. Other: None. IMPRESSION: No acute intracranial abnormality. Electronically Signed   By: Fidela Salisbury M.D.   On: 05/10/2019 12:46         Discharge Exam: Vitals:   05/13/19 0500 05/13/19 1102  BP: 114/77 115/65  Pulse: (!) 55   Resp: 16   Temp: 98 F (36.7 C)   SpO2: 100%    Vitals:   05/12/19 1947 05/12/19 2050 05/13/19 0500 05/13/19 1102  BP:  95/65 114/77 115/65  Pulse:  73 (!) 55   Resp:  16 16   Temp:  98.5 F (36.9 C) 98 F (36.7 C)   TempSrc:  Oral Oral   SpO2: 94% 96% 100%   Weight:      Height:        General: Pt is alert, awake, not in acute distress Cardiovascular: RRR, S1/S2 +, no rubs, no gallops Respiratory: diminished BS, but CTA Abdominal: Soft, NT, ND, bowel sounds + Extremities: no edema, no cyanosis   The results of significant diagnostics from this hospitalization (including imaging,  microbiology, ancillary and laboratory) are listed below for reference.    Significant Diagnostic Studies: DG Chest 1 View  Result Date: 05/10/2019 CLINICAL DATA:  Bradycardia and dizziness. EXAM: CHEST  1 VIEW COMPARISON:  11/20/2018 FINDINGS: The cardiac silhouette, mediastinal and hilar contours are within normal limits and stable. The lungs are clear. No pleural effusions or pneumothorax. No worrisome pulmonary lesions. The bony thorax is intact. IMPRESSION: No acute cardiopulmonary findings. Electronically Signed   By: Marijo Sanes M.D.   On: 05/10/2019 12:55   CT Head Wo Contrast  Result Date: 05/10/2019 CLINICAL DATA:  Ataxia. Stroke suspected. EXAM: CT HEAD WITHOUT CONTRAST TECHNIQUE: Contiguous axial images were obtained from the base of the skull through the vertex without intravenous contrast. COMPARISON:  August 29, 2001 FINDINGS: Brain: No evidence of acute infarction, hemorrhage, hydrocephalus, extra-axial collection or mass lesion/mass effect. Vascular: No hyperdense vessel or unexpected calcification. Skull: Normal. Negative for fracture or focal lesion. Sinuses/Orbits: No acute finding. Other: None. IMPRESSION: No acute intracranial abnormality. Electronically Signed   By: Fidela Salisbury M.D.   On: 05/10/2019 12:46     Microbiology:  Recent Results (from the past 240 hour(s))  SARS CORONAVIRUS 2 (Letia Guidry 6-24 HRS) Nasopharyngeal Nasopharyngeal Swab     Status: None   Collection Time: 05/10/19  1:30 PM   Specimen: Nasopharyngeal Swab  Result Value Ref Range Status   SARS Coronavirus 2 NEGATIVE NEGATIVE Final    Comment: (NOTE) SARS-CoV-2 target nucleic acids are NOT DETECTED. The SARS-CoV-2 RNA is generally detectable in upper and lower respiratory specimens during the acute phase of infection. Negative results do not preclude SARS-CoV-2 infection, do not rule out co-infections with other pathogens, and should not be used as the sole basis for treatment or other patient  management decisions. Negative results must be combined with clinical observations, patient history, and epidemiological information. The expected result is Negative. Fact Sheet for Patients: SugarRoll.be Fact Sheet for Healthcare Providers: https://www.woods-mathews.com/ This test is not yet approved or cleared by the [place] FDA and  has been authorized for detection and/or diagnosis of SARS-CoV-2 by FDA under an Emergency Use Authorization (EUA). This EUA will remain  in effect (meaning this test can be used) for the duration of the COVID-19 declaration under Section 56 4(b)(1) of the Act, 21 U.S.C. section 360bbb-3(b)(1), unless the authorization is terminated or revoked sooner. Performed at [facility], [place] [address], [place], [place] [address]      Labs: Basic Metabolic Panel: Recent Labs  Lab 05/10/19 1210 05/10/19 1210 05/11/19 0519 05/13/19 0718  NA 136  --  140 139  K 3.7   < > 3.9 3.7  CL 104  --  109 107  CO2 25  --  26 28  GLUCOSE 123*  --  91 84  BUN 10  --  13 14  CREATININE 0.90  --  1.05 0.96  CALCIUM 8.8*  --  8.4* 8.2*  MG  --   --   --  2.0   < > = values in this interval not displayed.   Liver Function Tests: Recent Labs  Lab 05/10/19 1210  AST 40  ALT 32  ALKPHOS 52  BILITOT 0.6  PROT 6.7  ALBUMIN 3.8   No results for input(s): LIPASE, AMYLASE in the last 168 hours. No results for input(s): AMMONIA in the last 168 hours. CBC: Recent Labs  Lab 05/10/19 1210 05/11/19 0519 05/13/19 0718  WBC 4.9 3.9* 4.4  NEUTROABS 3.2  --   --   HGB 13.2 12.3* 14.3  HCT 40.3 37.9* 43.2  MCV 97.1 96.2 96.9  PLT 263 234 247   Cardiac Enzymes: No results for input(s): CKTOTAL, CKMB, CKMBINDEX, TROPONINI in the last 168 hours. BNP: Invalid input(s): POCBNP CBG: Recent Labs  Lab 05/12/19 1617  GLUCAP 80    Time coordinating discharge:  36 minutes  Signed:  Orson Eva, DO [facility] Pager: [phone] 05/13/2019, 2:25 PM

## 2019-05-21 ENCOUNTER — Ambulatory Visit: Payer: No Typology Code available for payment source

## 2019-05-25 ENCOUNTER — Ambulatory Visit: Payer: No Typology Code available for payment source

## 2019-05-25 ENCOUNTER — Other Ambulatory Visit: Payer: Self-pay

## 2019-06-12 ENCOUNTER — Ambulatory Visit: Payer: No Typology Code available for payment source | Admitting: Cardiology

## 2019-06-19 ENCOUNTER — Other Ambulatory Visit: Payer: Self-pay

## 2019-06-19 ENCOUNTER — Ambulatory Visit (INDEPENDENT_AMBULATORY_CARE_PROVIDER_SITE_OTHER): Payer: No Typology Code available for payment source

## 2019-06-19 DIAGNOSIS — I480 Paroxysmal atrial fibrillation: Secondary | ICD-10-CM

## 2019-06-19 NOTE — Progress Notes (Signed)
ED Dr Tat ordered event, to see Dr.Mcdowell 06/26/19

## 2019-06-22 ENCOUNTER — Telehealth: Payer: Self-pay

## 2019-06-22 DIAGNOSIS — R001 Bradycardia, unspecified: Secondary | ICD-10-CM

## 2019-06-22 NOTE — Telephone Encounter (Signed)
Kent Sark, Kent Davidson  Bernita Raisin, RN; Evans Lance, Kent Davidson  Results reviewed. I also reviewed the chart since I last saw him in January including recent consultation by Dr. Harl Bowie. He has been having trouble with intermittent symptomatic bradycardia and also paroxysmal atrial fibrillation. Monitor shows similar findings with 7% atrial fibrillation burden overall, longest episode lasting over 21 hours. Heart rate got up into the 170s at peak in atrial fibrillation but he also had bradycardia as low as 37 when in sinus rhythm and evidence of post-termination bradycardia but no pauses. Please go ahead and get him scheduled for EP consultation as a suspect pacemaker may need to be considered. Cardizem CD dose was already cut back.        Patient notified of results, ref placed to Dr.Taylor, has apt Friday 5/21 with Dr.McDowell

## 2019-06-25 NOTE — Progress Notes (Signed)
Cardiology Office Note  Date: 06/26/2019   ID: La, Cabreros 09-14-1955, MRN [id number]  PCP:  System, Pcp Not In  Cardiologist:  [name], MD Electrophysiologist:  None   Chief Complaint  Patient presents with  . Cardiac follow-up    History of Present Illness: Kent Davidson is a 64 y.o. male last seen in January.  He presents for a routine visit today.  He was recently hospitalized with intermittent symptomatic bradycardia and paroxysmal atrial fibrillation, evaluated by Dr. Harl Bowie in consultation.  Cardiac monitor was placed at discharge which I have reviewed.  He had 7% atrial fibrillation burden with longest lasting episode over 21 hours.  Heart rate got up into the 170s at peak when in atrial fibrillation.  He also had bradycardia as low as 37 in sinus rhythm and evidence of post termination bradycardia but no pauses.  I have already communicated this to Dr. Lovena Le with request for EP consultation regarding pacemaker.  Cardizem CD dose was cut back from 240 mg daily to 120 mg daily.  He presents today for follow-up, states that he has not had any further episodes of lightheadedness or fatigue, including when he was wearing the cardiac monitor.  He ran out of Cardizem CD 120 mg tablets on Wednesday.  He is in sinus rhythm today with heart rate in the low 50s.  Past Medical History:  Diagnosis Date  . Atrial fibrillation [facility])    Documented October 2020  . Esophageal obstruction due to food impaction    October 2020  . Gastroesophageal reflux     Past Surgical History:  Procedure Laterality Date  . APPENDECTOMY    . ESOPHAGEAL DILATION  11/19/2018   Procedure: ESOPHAGEAL DILATION;  Surgeon: Rogene Houston, MD;  Location: [facility];  Service: Endoscopy;;  . ESOPHAGOGASTRODUODENOSCOPY N/A 11/19/2018   Procedure: ESOPHAGOGASTRODUODENOSCOPY (EGD);  Surgeon: Rogene Houston, MD;  Location: [facility];  Service: Endoscopy;  Laterality: N/A;    . I & D EXTREMITY Right 10/03/2018   Procedure: IRRIGATION AND DEBRIDEMENT  RIGHT ULNA;  Surgeon: Carole Civil, MD;  Location: [facility] ORS;  Service: Orthopedics;  Laterality: Right;  . ORIF ULNAR FRACTURE Right 10/03/2018   Procedure: OPEN REDUCTION INTERNAL FIXATION (ORIF) RIGHT ULNAR FRACTURE;  Surgeon: Carole Civil, MD;  Location: [facility];  Service: Orthopedics;  Laterality: Right;  . TENDON REPAIR  11/13/2011   Procedure: TENDON REPAIR;  Surgeon: Tennis Must, MD;  Location: [facility];  Service: Orthopedics;  Laterality: Left;  incision and drainage with percutaneous pinning left index finger     Current Outpatient Medications  Medication Sig Dispense Refill  . aspirin EC 81 MG tablet Take 1 tablet (81 mg total) by mouth daily. 90 tablet 3  . pantoprazole (PROTONIX) 40 MG tablet Take 1 tablet (40 mg total) by mouth 2 (two) times daily. 60 tablet 1  . diltiazem (CARDIZEM) 30 MG tablet May take as needed  30 mg for rapid heart rate over 100. May repeat once after 30 minutes. Take no more than 4 tablets in 24 hours 60 tablet 3   No current facility-administered medications for this visit.   Allergies:  Bee venom   ROS:  No chest pain or recent syncope.  Physical Exam: VS:  BP 116/74   Pulse (!) 52   Ht 5\' 11"  (1.803 m)   Wt 142 lb (64.4 kg)   SpO2 98%   BMI 19.80 kg/m , BMI  Body mass index is 19.8 kg/m.  Wt Readings from Last 3 Encounters:  06/26/19 142 lb (64.4 kg)  05/10/19 140 lb (63.5 kg)  02/11/19 145 lb (65.8 kg)    General: Patient appears comfortable at rest. HEENT: Conjunctiva and lids normal, wearing a mask. Neck: Supple, no elevated JVP or carotid bruits, no thyromegaly. Lungs: Clear to auscultation, nonlabored breathing at rest. Cardiac: Regular rate and rhythm, no S3 or significant systolic murmur, no pericardial rub. Extremities: No pitting edema, distal pulses 2+.  ECG:  An ECG dated 05/10/2019 was personally reviewed today and  demonstrated:  Sinus bradycardia at 44 beats per minutes with IVCD.  Recent Labwork: 05/10/2019: ALT 32; AST 40 05/11/2019: TSH 5.948 05/13/2019: BUN 14; Creatinine, Ser 0.96; Hemoglobin 14.3; Magnesium 2.0; Platelets 247; Potassium 3.7; Sodium 139   Other Studies Reviewed Today:  Echocardiogram 02/09/2019: 1. Left ventricular ejection fraction, by visual estimation, is 55 to  60%. The left ventricle has normal function. There is no left ventricular  hypertrophy.  2. The left ventricle has no regional wall motion abnormalities.  3. Global right ventricle has normal systolic function.The right  ventricular size is normal. No increase in right ventricular wall  thickness.  4. Left atrial size was moderately dilated.  5. Right atrial size was severely dilated.  6. The mitral valve is grossly normal. Trivial mitral valve  regurgitation.  7. The tricuspid valve is grossly normal.  8. The aortic valve is tricuspid. Aortic valve regurgitation is not  visualized.  9. The pulmonic valve was not well visualized. Pulmonic valve  regurgitation is trivial.  10. Aortic dilatation noted.  11. There is borderline dilatation of the aortic root.  12. Normal pulmonary artery systolic pressure.  13. The tricuspid regurgitant velocity is 2.38 m/s, and with an assumed  right atrial pressure of 3 mmHg, the estimated right ventricular systolic  pressure is normal at 25.7 mmHg.  14. The inferior vena cava is normal in size with greater than 50%  respiratory variability, suggesting right atrial pressure of 3 mmHg.   Cardiac monitor May 2021: ZIO XT reviewed.  13 days 5 hours analyzed.  Predominant rhythm is sinus with heart rate ranging from 37 bpm up to 117 bpm and average heart rate 57 bpm.  Rare PACs and PVCs were noted representing less than 1% of total beats.  There were bursts of SVT and also atrial fibrillation (7% rhythm burden).  Longest episode of atrial fibrillation lasted 21 hours 8 minutes.   Peak heart rate in atrial fibrillation was in the 170s.  Slowest heart rate of 37 bpm was sinus bradycardia during early morning hours, presumably while asleep.  There were no pauses specifically, but some post-termination bradycardia.  Assessment and Plan:  1.  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  He has done better since cutting Cardizem CD back from 240 mg daily to 120 mg daily.  He has pending consultation with Dr. Lovena Le for discussion of potential pacemaker, but we might be able to adopt a strategy of observation for now.  I plan to keep him off Cardizem CD completely and will give him Cardizem 30 mg tablets to take for rapid palpitations.  Resting heart rate in sinus rhythm today is in the low 50s and he is asymptomatic.  Otherwise continue aspirin.  2.  History of cardiomyopathy with most recent echocardiogram in January revealing LVEF 55 to 60%.  Medication Adjustments/Labs and Tests Ordered: Current medicines are reviewed at length  with the patient today.  Concerns regarding medicines are outlined above.   Tests Ordered: No orders of the defined types were placed in this encounter.   Medication Changes: Meds ordered this encounter  Medications  . diltiazem (CARDIZEM) 30 MG tablet    Sig: May take as needed  30 mg for rapid heart rate over 100. May repeat once after 30 minutes. Take no more than 4 tablets in 24 hours    Dispense:  60 tablet    Refill:  3    Disposition:  Follow up 3 months in the [place] office.  Signed, Satira Sark, MD, [facility] 06/26/2019 10:04 AM    [facility] [place]. [address], [place], [place] [address] Phone: [phone]; Fax: [phone]

## 2019-06-26 ENCOUNTER — Other Ambulatory Visit: Payer: Self-pay

## 2019-06-26 ENCOUNTER — Ambulatory Visit (INDEPENDENT_AMBULATORY_CARE_PROVIDER_SITE_OTHER): Payer: No Typology Code available for payment source | Admitting: Cardiology

## 2019-06-26 ENCOUNTER — Encounter: Payer: Self-pay | Admitting: Cardiology

## 2019-06-26 VITALS — BP 116/74 | HR 52 | Ht 71.0 in | Wt 142.0 lb

## 2019-06-26 DIAGNOSIS — I480 Paroxysmal atrial fibrillation: Secondary | ICD-10-CM

## 2019-06-26 DIAGNOSIS — I495 Sick sinus syndrome: Secondary | ICD-10-CM | POA: Diagnosis not present

## 2019-06-26 MED ORDER — DILTIAZEM HCL 30 MG PO TABS: ORAL_TABLET | ORAL | 3 refills | Status: AC

## 2019-06-26 NOTE — Patient Instructions (Signed)
Medication Instructions:  STOP Cardizem CD    Take Cardizem 30 mg for heart rate over 100. May repeat in 30 minutes .May take a total of 4 tablets in 24 hours.  *If you need a refill on your cardiac medications before your next appointment, please call your pharmacy*   Lab Work: None today If you have labs (blood work) drawn today and your tests are completely normal, you will receive your results only by: Marland Kitchen MyChart Message (if you have MyChart) OR . A paper copy in the mail If you have any lab test that is abnormal or we need to change your treatment, we will call you to review the results.   Testing/Procedures: None today   Follow-Up: At [facility], you and your health needs are our priority.  As part of our continuing mission to provide you with exceptional heart care, we have created designated Provider Care Teams.  These Care Teams include your primary Cardiologist (physician) and Advanced Practice Providers (APPs -  Physician Assistants and Nurse Practitioners) who all work together to provide you with the care you need, when you need it.  We recommend signing up for the patient portal called "MyChart".  Sign up information is provided on this After Visit Summary.  MyChart is used to connect with patients for Virtual Visits (Telemedicine).  Patients are able to view lab/test results, encounter notes, upcoming appointments, etc.  Non-urgent messages can be sent to your provider as well.   To learn more about what you can do with MyChart, go to [email or web address].    Your next appointment:   3 month(s)  The format for your next appointment:   In Person  Provider:   [name], MD   Other Instructions None      Thank you for choosing [facility] !

## 2019-07-13 ENCOUNTER — Ambulatory Visit: Payer: No Typology Code available for payment source | Admitting: Internal Medicine

## 2019-09-29 ENCOUNTER — Other Ambulatory Visit: Payer: Self-pay

## 2019-09-29 ENCOUNTER — Encounter: Payer: Self-pay | Admitting: Student

## 2019-09-29 ENCOUNTER — Ambulatory Visit (INDEPENDENT_AMBULATORY_CARE_PROVIDER_SITE_OTHER): Payer: No Typology Code available for payment source | Admitting: Student

## 2019-09-29 VITALS — BP 96/64 | HR 60 | Ht 71.0 in | Wt 137.0 lb

## 2019-09-29 DIAGNOSIS — I480 Paroxysmal atrial fibrillation: Secondary | ICD-10-CM | POA: Diagnosis not present

## 2019-09-29 DIAGNOSIS — I495 Sick sinus syndrome: Secondary | ICD-10-CM

## 2019-09-29 DIAGNOSIS — K224 Dyskinesia of esophagus: Secondary | ICD-10-CM | POA: Diagnosis not present

## 2019-09-29 DIAGNOSIS — Z720 Tobacco use: Secondary | ICD-10-CM | POA: Diagnosis not present

## 2019-09-29 MED ORDER — PANTOPRAZOLE SODIUM 40 MG PO TBEC: 40.0000 mg | DELAYED_RELEASE_TABLET | Freq: Two times a day (BID) | ORAL | 3 refills | Status: DC

## 2019-09-29 NOTE — Patient Instructions (Signed)
Medication Instructions:  Your physician recommends that you continue on your current medications as directed. Please refer to the Current Medication list given to you today.  *If you need a refill on your cardiac medications before your next appointment, please call your pharmacy*   Lab Work: NONE   If you have labs (blood work) drawn today and your tests are completely normal, you will receive your results only by: . MyChart Message (if you have MyChart) OR . A paper copy in the mail If you have any lab test that is abnormal or we need to change your treatment, we will call you to review the results.   Testing/Procedures: NONE    Follow-Up: At [facility], you and your health needs are our priority.  As part of our continuing mission to provide you with exceptional heart care, we have created designated Provider Care Teams.  These Care Teams include your primary Cardiologist (physician) and Advanced Practice Providers (APPs -  Physician Assistants and Nurse Practitioners) who all work together to provide you with the care you need, when you need it.  We recommend signing up for the patient portal called "MyChart".  Sign up information is provided on this After Visit Summary.  MyChart is used to connect with patients for Virtual Visits (Telemedicine).  Patients are able to view lab/test results, encounter notes, upcoming appointments, etc.  Non-urgent messages can be sent to your provider as well.   To learn more about what you can do with MyChart, go to [email or web address].com.    Your next appointment:   6 month(s)  The format for your next appointment:   In Person  Provider:   Samuel McDowell, MD   Other Instructions Thank you for choosing [facility]!    

## 2019-09-29 NOTE — Progress Notes (Signed)
Cardiology Office Note    Date:  09/29/2019   ID:  Kent, Davidson 1955/12/11, MRN [id number]  PCP:  System, Pcp Not In  Cardiologist: [name], MD    Chief Complaint  Patient presents with   Follow-up    3 month visit    History of Present Illness:    Kent Davidson is a 64 y.o. male with past medical history of paroxysmal atrial fibrillation, history of cardiomyopathy (EF previously reduced at 45% in 11/2018, improved to 55 to 60% by repeat echo in 02/2019) and tobacco use who presents to the office today for 46-month follow-up.  He was last examined Dr. Domenic Polite in 06/2019 following a recent admission at [facility] for evaluation of worsening dizziness and presyncope. He was initially in atrial fibrillation with RVR but rates did improve and had been in the 50's to 60's when evaluated by Cardiology, therefore Cardizem CD dosing was reduced from 240 mg daily to 120 mg daily. He did have a 2-week event monitor which showed episodes of paroxysmal atrial fibrillation with peak heart rate in the 170's but he also had episodes of sinus bradycardia with heart rate in the 30's but no significant pauses. He reported feeling well at the time of his visit and denied any recurrent dizziness or fatigue. Continued observation was recommended at that time and it was mentioned in the notes he did have a pending consultation with Dr. Lovena Le for discussion of PPM placement. Given that his heart rate was in the 50's at the time of his visit, Cardizem CD was discontinued and he was given an Rx for as needed Cardizem 30 mg for palpitations. He canceled his visit in 07/2019 with Dr. Lovena Le due to feeling well at that time.   In talking with the patient today, he reports overall doing well from a cardiac perspective since his last visit. He only reports 2-3 brief episodes of palpitations and symptoms resolved with short-acting Cardizem x1. No persistent symptoms. He denies any recent  chest pain or dyspnea on exertion. 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 No anginal symptoms when walking for long distances or carrying heavy objects. He denies any orthopnea, PND, edema, dizziness or presyncope.   He does continue to smoke approximately 1 ppd (previously 2-3 ppd). Also uses smokeless tobacco. Still consumes several caffeinated drinks per day. No alcohol use.    Past Medical History:  Diagnosis Date   Atrial fibrillation [facility])    Documented October 2020   Esophageal obstruction due to food impaction    October 2020   Gastroesophageal reflux     Past Surgical History:  Procedure Laterality Date   APPENDECTOMY     ESOPHAGEAL DILATION  11/19/2018   Procedure: ESOPHAGEAL DILATION;  Surgeon: Rogene Houston, MD;  [facility];  Service: Endoscopy;;   ESOPHAGOGASTRODUODENOSCOPY N/A 11/19/2018   Procedure: ESOPHAGOGASTRODUODENOSCOPY (EGD);  Surgeon: Rogene Houston, MD;  [facility];  Service: Endoscopy;  Laterality: N/A;   I & D EXTREMITY Right 10/03/2018   Procedure: IRRIGATION AND DEBRIDEMENT  RIGHT ULNA;  Surgeon: Carole Civil, MD;  Location: [facility];  Service: Orthopedics;  Laterality: Right;   ORIF ULNAR FRACTURE Right 10/03/2018   Procedure: OPEN REDUCTION INTERNAL FIXATION (ORIF) RIGHT ULNAR FRACTURE;  Surgeon: Carole Civil, MD;  Location: [facility];  Service: Orthopedics;  Laterality: Right;   TENDON REPAIR  11/13/2011   Procedure: TENDON REPAIR;  Surgeon: Jackolyn Confer  Fredna Dow, MD;  Location: [place];  Service: Orthopedics;  Laterality: Left;  incision and drainage with percutaneous pinning left index finger     Current Medications: Outpatient Medications Prior to Visit  Medication Sig Dispense Refill   aspirin EC 81 MG tablet Take 1 tablet (81 mg total) by mouth daily. 90 tablet 3   diltiazem (CARDIZEM) 30 MG tablet May take as needed  30 mg for rapid heart rate over 100. May repeat once  after 30 minutes. Take no more than 4 tablets in 24 hours 60 tablet 3   pantoprazole (PROTONIX) 40 MG tablet Take 1 tablet (40 mg total) by mouth 2 (two) times daily. 60 tablet 1   No facility-administered medications prior to visit.     Allergies:   Bee venom   Social History   Socioeconomic History   Marital status: Divorced    Spouse name: Not on file   Number of children: Not on file   Years of education: Not on file   Highest education level: Not on file  Occupational History   Not on file  Tobacco Use   Smoking status: Current Every Day Smoker    Packs/day: 0.50    Types: Cigarettes   Smokeless tobacco: Current User    Types: Snuff  Vaping Use   Vaping Use: Never used  Substance and Sexual Activity   Alcohol use: No   Drug use: No   Sexual activity: Not on file  Other Topics Concern   Not on file  Social History Narrative   Not on file   Social Determinants of Health   Financial Resource Strain:    Difficulty of Paying Living Expenses: Not on file  Food Insecurity:    Worried About [place] in the Last Year: Not on file   Ran Out of Food in the Last Year: Not on file  Transportation Needs:    Lack of Transportation (Medical): Not on file   Lack of Transportation (Non-Medical): Not on file  Physical Activity:    Days of Exercise per Week: Not on file   Minutes of Exercise per Session: Not on file  Stress:    Feeling of Stress : Not on file  Social Connections:    Frequency of Communication with Friends and Family: Not on file   Frequency of Social Gatherings with Friends and Family: Not on file   Attends Religious Services: Not on file   Active Member of Clubs or Organizations: Not on file   Attends Archivist Meetings: Not on file   Marital Status: Not on file     Family History:  The patient's family history includes Diabetes in his mother; Heart attack in his mother; Hypertension in his mother.    Review of Systems:   Please see the history of present illness.     General:  No chills, fever, night sweats or weight changes.  Cardiovascular:  No chest pain, dyspnea on exertion, edema, orthopnea, paroxysmal nocturnal dyspnea. Positive for palpitations (transiet).  Dermatological: No rash, lesions/masses Respiratory: No cough, dyspnea Urologic: No hematuria, dysuria Abdominal:   No nausea, vomiting, diarrhea, bright red blood per rectum, melena, or hematemesis Neurologic:  No visual changes, wkns, changes in mental status.  All other systems reviewed and are otherwise negative except as noted above.   Physical Exam:    VS:  BP 96/64    Pulse 60    Ht 5\' 11"  (1.803 m)    Wt 137  lb (62.1 kg)    SpO2 96%    BMI 19.11 kg/m    General: Well developed, well nourished,male appearing in no acute distress. Head: Normocephalic, atraumatic. Neck: No carotid bruits. JVD not elevated.  Lungs: Respirations regular and unlabored, without wheezes or rales.  Heart: Regular rate and rhythm. No S3 or S4.  No murmur, no rubs, or gallops appreciated. Abdomen: Appears non-distended. No obvious abdominal masses. Msk:  Strength and tone appear normal for age. No obvious joint deformities or effusions. Extremities: No clubbing or cyanosis. No lower extremity edema.  Distal pedal pulses are 2+ bilaterally. Neuro: Alert and oriented X 3. Moves all extremities spontaneously. No focal deficits noted. Psych:  Responds to questions appropriately with a normal affect. Skin: No rashes or lesions noted  Wt Readings from Last 3 Encounters:  09/29/19 137 lb (62.1 kg)  06/26/19 142 lb (64.4 kg)  05/10/19 140 lb (63.5 kg)     Studies/Labs Reviewed:   EKG:  EKG is not ordered today.    Recent Labs: 05/10/2019: ALT 32 05/11/2019: TSH 5.948 05/13/2019: BUN 14; Creatinine, Ser 0.96; Hemoglobin 14.3; Magnesium 2.0; Platelets 247; Potassium 3.7; Sodium 139   Lipid Panel No results found for: CHOL, TRIG, HDL,  CHOLHDL, VLDL, LDLCALC, LDLDIRECT  Additional studies/ records that were reviewed today include:   Echocardiogram: 02/2019 IMPRESSIONS    1. Left ventricular ejection fraction, by visual estimation, is 55 to  60%. The left ventricle has normal function. There is no left ventricular  hypertrophy.  2. The left ventricle has no regional wall motion abnormalities.  3. Global right ventricle has normal systolic function.The right  ventricular size is normal. No increase in right ventricular wall  thickness.  4. Left atrial size was moderately dilated.  5. Right atrial size was severely dilated.  6. The mitral valve is grossly normal. Trivial mitral valve  regurgitation.  7. The tricuspid valve is grossly normal.  8. The aortic valve is tricuspid. Aortic valve regurgitation is not  visualized.  9. The pulmonic valve was not well visualized. Pulmonic valve  regurgitation is trivial.  10. Aortic dilatation noted.  11. There is borderline dilatation of the aortic root.  12. Normal pulmonary artery systolic pressure.  13. The tricuspid regurgitant velocity is 2.38 m/s, and with an assumed  right atrial pressure of 3 mmHg, the estimated right ventricular systolic  pressure is normal at 25.7 mmHg.  14. The inferior vena cava is normal in size with greater than 50%  respiratory variability, suggesting right atrial pressure of 3 mmHg.    Event Monitor: 06/2019 ZIO XT reviewed.  13 days 5 hours analyzed.  Predominant rhythm is sinus with heart rate ranging from 37 bpm up to 117 bpm and average heart rate 57 bpm.  Rare PACs and PVCs were noted representing less than 1% of total beats.  There were bursts of SVT and also atrial fibrillation (7% rhythm burden).  Longest episode of atrial fibrillation lasted 21 hours 8 minutes.  Peak heart rate in atrial fibrillation was in the 170s.  Slowest heart rate of 37 bpm was sinus bradycardia during early morning hours, presumably while asleep.   There were no pauses specifically, but some post-termination bradycardia.   Assessment:    1. Paroxysmal atrial fibrillation (HCC)   2. Tachycardia-bradycardia syndrome ([place])   3. Esophageal dysmotility   4. Tobacco use      Plan:   In order of problems listed above:  1. Paroxysmal Atrial Fibrillation/Tachy-brady Syndrome - His  event monitor earlier this year showed episodes of paroxysmal atrial fibrillation with peak heart rate in the 170's but he also had episodes of sinus bradycardia with heart rate in the 30's but no significant pauses. No longer on daily AV nodal blocking agents and symptoms have been infrequent. He does have an Rx for PRN short-acting Cardizem 30 mg. I encouraged him to make [place] aware if symptoms increase in frequency or severity as he would need to be evaluated by EP for consideration of PPM placement. He prefers conservative management at this time given no recent symptoms.  - This patients CHA2DS2-VASc Score and unadjusted Ischemic Stroke Rate (% per year) is equal to 0.2 % stroke rate/year from a score of 0. Will increase to 1 soon as he turns 64 yo in 11/2019.  2. Esophageal Dysmotility - He has remained on Protonix 40mg  BID per GI recommendations. He is almost out of the medication and a refill was sent today.   3. Tobacco Use - He continues to smoke 1 ppd (previously 2-3 ppd). Congratulated on his reduction with cessation advised.    Medication Adjustments/Labs and Tests Ordered: Current medicines are reviewed at length with the patient today.  Concerns regarding medicines are outlined above.  Medication changes, Labs and Tests ordered today are listed in the Patient Instructions below. Patient Instructions  Medication Instructions:  Your physician recommends that you continue on your current medications as directed. Please refer to the Current Medication list given to you today.  *If you need a refill on your cardiac medications before your next  appointment, please call your pharmacy*   Lab Work: NONE   If you have labs (blood work) drawn today and your tests are completely normal, you will receive your results only by:  [place] (if you have MyChart) OR  A paper copy in the mail If you have any lab test that is abnormal or we need to change your treatment, we will call you to review the results.   Testing/Procedures: NONE   Follow-Up: At [facility], you and your health needs are our priority.  As part of our continuing mission to provide you with exceptional heart care, we have created designated Provider Care Teams.  These Care Teams include your primary Cardiologist (physician) and Advanced Practice Providers (APPs -  Physician Assistants and Nurse Practitioners) who all work together to provide you with the care you need, when you need it.  We recommend signing up for the patient portal called "MyChart".  Sign up information is provided on this After Visit Summary.  MyChart is used to connect with patients for Virtual Visits (Telemedicine).  Patients are able to view lab/test results, encounter notes, upcoming appointments, etc.  Non-urgent messages can be sent to your provider as well.   To learn more about what you can do with MyChart, go to [email or web address].    Your next appointment:   6 month(s)  The format for your next appointment:   In Person  Provider:   [name], MD   Other Instructions Thank you for choosing [facility]!     Signed, Erma Heritage, PA-C  09/29/2019 6:24 PM    [place] [address] [place], [place] [address] Phone: [phone] Fax: [phone]

## 2020-03-30 NOTE — Progress Notes (Deleted)
Cardiology Office Note  Date: 03/30/2020   ID: [name], [name] 12-01-1955, MRN [id number]  PCP:  Pcp, No  Cardiologist:  [name], MD Electrophysiologist:  None   No chief complaint on file.   History of Present Illness: Kent Davidson is a 65 y.o. male and in August 2021 by Ms. Strader PA-C.  CHA2DS2-VASc score is 1 at this point.  Past Medical History:  Diagnosis Date  . Atrial fibrillation [facility])    Documented October 2020  . Esophageal obstruction due to food impaction    October 2020  . Gastroesophageal reflux     Past Surgical History:  Procedure Laterality Date  . APPENDECTOMY    . ESOPHAGEAL DILATION  11/19/2018   Procedure: ESOPHAGEAL DILATION;  Surgeon: Rogene Houston, MD;  [facility];  Service: Endoscopy;;  . ESOPHAGOGASTRODUODENOSCOPY N/A 11/19/2018   Procedure: ESOPHAGOGASTRODUODENOSCOPY (EGD);  Surgeon: Rogene Houston, MD;  [facility];  Service: Endoscopy;  Laterality: N/A;  . I & D EXTREMITY Right 10/03/2018   Procedure: IRRIGATION AND DEBRIDEMENT  RIGHT ULNA;  Surgeon: Carole Civil, MD;  [facility];  Service: Orthopedics;  Laterality: Right;  . ORIF ULNAR FRACTURE Right 10/03/2018   Procedure: OPEN REDUCTION INTERNAL FIXATION (ORIF) RIGHT ULNAR FRACTURE;  Surgeon: Carole Civil, MD;  [facility];  Service: Orthopedics;  Laterality: Right;  . TENDON REPAIR  11/13/2011   Procedure: TENDON REPAIR;  Surgeon: Tennis Must, MD;  Location: [place];  Service: Orthopedics;  Laterality: Left;  incision and drainage with percutaneous pinning left index finger     Current Outpatient Medications  Medication Sig Dispense Refill  . aspirin EC 81 MG tablet Take 1 tablet (81 mg total) by mouth daily. 90 tablet 3  . diltiazem (CARDIZEM) 30 MG tablet May take as needed  30 mg for rapid heart rate over 100. May repeat once after 30 minutes. Take no more than 4 tablets in 24 hours  60 tablet 3  . pantoprazole (PROTONIX) 40 MG tablet Take 1 tablet (40 mg total) by mouth 2 (two) times daily. 180 tablet 3   No current facility-administered medications for this visit.   Allergies:  Bee venom   Social History: The patient  reports that he has been smoking cigarettes. He has been smoking about 0.50 packs per day. His smokeless tobacco use includes snuff. He reports that he does not drink alcohol and does not use drugs.   Family History: The patient's family history includes Diabetes in his mother; Heart attack in his mother; Hypertension in his mother.   ROS:  Please see the history of present illness. Otherwise, complete review of systems is positive for {NONE DEFAULTED:18576::"none"}.  All other systems are reviewed and negative.   Physical Exam: VS:  There were no vitals taken for this visit., BMI There is no height or weight on file to calculate BMI.  Wt Readings from Last 3 Encounters:  09/29/19 137 lb (62.1 kg)  06/26/19 142 lb (64.4 kg)  05/10/19 140 lb (63.5 kg)    General: Patient appears comfortable at rest. HEENT: Conjunctiva and lids normal, oropharynx clear with moist mucosa. Neck: Supple, no elevated JVP or carotid bruits, no thyromegaly. Lungs: Clear to auscultation, nonlabored breathing at rest. Cardiac: Regular rate and rhythm, no S3 or significant systolic murmur, no pericardial rub. Abdomen: Soft, nontender, no hepatomegaly, bowel sounds present, no guarding or rebound. Extremities: No pitting edema, distal pulses 2+. Skin:  Warm and dry. Musculoskeletal: No kyphosis. Neuropsychiatric: Alert and oriented x3, affect grossly appropriate.  ECG:  An ECG dated 05/10/2019 was personally reviewed today and demonstrated:  Sinus bradycardia.  Recent Labwork: 05/10/2019: ALT 32; AST 40 05/11/2019: TSH 5.948 05/13/2019: BUN 14; Creatinine, Ser 0.96; Hemoglobin 14.3; Magnesium 2.0; Platelets 247; Potassium 3.7; Sodium 139   Other Studies Reviewed  Today:  Echocardiogram 02/09/2019: 1. Left ventricular ejection fraction, by visual estimation, is 55 to  60%. The left ventricle has normal function. There is no left ventricular  hypertrophy.  2. The left ventricle has no regional wall motion abnormalities.  3. Global right ventricle has normal systolic function.The right  ventricular size is normal. No increase in right ventricular wall  thickness.  4. Left atrial size was moderately dilated.  5. Right atrial size was severely dilated.  6. The mitral valve is grossly normal. Trivial mitral valve  regurgitation.  7. The tricuspid valve is grossly normal.  8. The aortic valve is tricuspid. Aortic valve regurgitation is not  visualized.  9. The pulmonic valve was not well visualized. Pulmonic valve  regurgitation is trivial.  10. Aortic dilatation noted.  11. There is borderline dilatation of the aortic root.  12. Normal pulmonary artery systolic pressure.  13. The tricuspid regurgitant velocity is 2.38 m/s, and with an assumed  right atrial pressure of 3 mmHg, the estimated right ventricular systolic  pressure is normal at 25.7 mmHg.  14. The inferior vena cava is normal in size with greater than 50%  respiratory variability, suggesting right atrial pressure of 3 mmHg.   Cardiac monitor 06/19/2019: ZIO XT reviewed.  13 days 5 hours analyzed.  Predominant rhythm is sinus with heart rate ranging from 37 bpm up to 117 bpm and average heart rate 57 bpm.  Rare PACs and PVCs were noted representing less than 1% of total beats.  There were bursts of SVT and also atrial fibrillation (7% rhythm burden).  Longest episode of atrial fibrillation lasted 21 hours 8 minutes.  Peak heart rate in atrial fibrillation was in the 170s.  Slowest heart rate of 37 bpm was sinus bradycardia during early morning hours, presumably while asleep.  There were no pauses specifically, but some post-termination bradycardia.  Assessment and  Plan:   Medication Adjustments/Labs and Tests Ordered: Current medicines are reviewed at length with the patient today.  Concerns regarding medicines are outlined above.   Tests Ordered: No orders of the defined types were placed in this encounter.   Medication Changes: No orders of the defined types were placed in this encounter.   Disposition:  Follow up {follow up:15908}  Signed, Satira Sark, MD, [facility] 03/30/2020 4:56 PM    [facility] [address], [place], [place] [address] Phone: [phone]; Fax: [phone]

## 2020-03-31 ENCOUNTER — Ambulatory Visit: Payer: No Typology Code available for payment source | Admitting: Cardiology

## 2020-03-31 DIAGNOSIS — I480 Paroxysmal atrial fibrillation: Secondary | ICD-10-CM

## 2020-03-31 DIAGNOSIS — I495 Sick sinus syndrome: Secondary | ICD-10-CM

## 2020-11-15 DIAGNOSIS — K219 Gastro-esophageal reflux disease without esophagitis: Secondary | ICD-10-CM | POA: Diagnosis not present

## 2020-11-15 DIAGNOSIS — Z0189 Encounter for other specified special examinations: Secondary | ICD-10-CM | POA: Diagnosis not present

## 2020-11-15 DIAGNOSIS — I4891 Unspecified atrial fibrillation: Secondary | ICD-10-CM | POA: Diagnosis not present

## 2020-12-15 DIAGNOSIS — I4891 Unspecified atrial fibrillation: Secondary | ICD-10-CM | POA: Diagnosis not present

## 2020-12-20 DIAGNOSIS — E02 Subclinical iodine-deficiency hypothyroidism: Secondary | ICD-10-CM | POA: Diagnosis not present

## 2020-12-20 DIAGNOSIS — Z23 Encounter for immunization: Secondary | ICD-10-CM | POA: Diagnosis not present

## 2020-12-20 DIAGNOSIS — H6123 Impacted cerumen, bilateral: Secondary | ICD-10-CM | POA: Diagnosis not present

## 2020-12-20 DIAGNOSIS — I4891 Unspecified atrial fibrillation: Secondary | ICD-10-CM | POA: Diagnosis not present

## 2020-12-20 DIAGNOSIS — F17200 Nicotine dependence, unspecified, uncomplicated: Secondary | ICD-10-CM | POA: Diagnosis not present

## 2020-12-20 DIAGNOSIS — Z0001 Encounter for general adult medical examination with abnormal findings: Secondary | ICD-10-CM | POA: Diagnosis not present

## 2021-01-24 DIAGNOSIS — I4891 Unspecified atrial fibrillation: Secondary | ICD-10-CM | POA: Diagnosis not present

## 2021-01-31 DIAGNOSIS — Z1159 Encounter for screening for other viral diseases: Secondary | ICD-10-CM | POA: Diagnosis not present

## 2021-01-31 DIAGNOSIS — E039 Hypothyroidism, unspecified: Secondary | ICD-10-CM | POA: Diagnosis not present

## 2021-01-31 DIAGNOSIS — E782 Mixed hyperlipidemia: Secondary | ICD-10-CM | POA: Diagnosis not present

## 2021-01-31 DIAGNOSIS — E559 Vitamin D deficiency, unspecified: Secondary | ICD-10-CM | POA: Diagnosis not present

## 2021-01-31 DIAGNOSIS — R079 Chest pain, unspecified: Secondary | ICD-10-CM | POA: Diagnosis not present

## 2021-01-31 DIAGNOSIS — R001 Bradycardia, unspecified: Secondary | ICD-10-CM | POA: Diagnosis not present

## 2021-01-31 DIAGNOSIS — L282 Other prurigo: Secondary | ICD-10-CM | POA: Diagnosis not present

## 2021-03-09 NOTE — Progress Notes (Signed)
CARDIOLOGY CONSULT NOTE       Patient ID: Kent Davidson MRN: [id number] DOB/AGE: 09-14-1955 66 y.o.  Admit date: (Not on file) Referring Physician: [place] Crane Primary Physician: Celene Squibb, MD Primary Cardiologist: Domenic Polite Reason for Consultation: Bradycardia     HPI:  66 y.o. 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 He smokes and uses chewing tobacco Echo in 2021 with normal EF moderate LAE and severe RAE   CHADVASC 1 on ASA   He has known esophageal dysmotility on Protonix and has had previous esophageal dilatation   TSH has been mildly elvated 5.9 a year ago Now on synthroid  Has occasional palpitations but nothing sustained Works outside all the time alone tending to pastures and horses   ROS All other systems reviewed and negative except as noted above  Past Medical History:  Diagnosis Date   Atrial fibrillation ([place])    Documented October 2020   Esophageal obstruction due to food impaction    October 2020   Gastroesophageal reflux     Family History  Problem Relation Age of Onset   Diabetes Mother    Hypertension Mother    Heart attack Mother     Social History   Socioeconomic History   Marital status: Divorced    Spouse name: Not on file   Number of children: Not on file   Years of education: Not on file   Highest education level: Not on file  Occupational History   Not on file  Tobacco Use   Smoking status: Every Day    Packs/day: 0.50    Types: Cigarettes   Smokeless tobacco: Current    Types: Snuff  Vaping Use   Vaping Use: Never used  Substance and Sexual Activity   Alcohol use: No   Drug use:  No   Sexual activity: Not on file  Other Topics Concern   Not on file  Social History Narrative   Not on file   Social Determinants of Health   Financial Resource Strain: Not on file  Food Insecurity: Not on file  Transportation Needs: Not on file  Physical Activity: Not on file  Stress: Not on file  Social Connections: Not on file  Intimate Partner Violence: Not on file    Past Surgical History:  Procedure Laterality Date   APPENDECTOMY     ESOPHAGEAL DILATION  11/19/2018   Procedure: ESOPHAGEAL DILATION;  Surgeon: Rogene Houston, MD;  [facility];  Service: Endoscopy;;   ESOPHAGOGASTRODUODENOSCOPY N/A 11/19/2018   Procedure: ESOPHAGOGASTRODUODENOSCOPY (EGD);  Surgeon: Rogene Houston, MD;  [facility];  Service: Endoscopy;  Laterality: N/A;   I & D EXTREMITY Right 10/03/2018   Procedure: IRRIGATION AND DEBRIDEMENT  RIGHT ULNA;  Surgeon: Carole Civil, MD;  Location: [facility];  Service: Orthopedics;  Laterality: Right;   ORIF ULNAR FRACTURE Right 10/03/2018   Procedure: OPEN REDUCTION INTERNAL FIXATION (ORIF) RIGHT ULNAR FRACTURE;  Surgeon: Carole Civil, MD;  Location: [facility];  Service: Orthopedics;  Laterality: Right;   TENDON REPAIR  11/13/2011   Procedure: TENDON REPAIR;  Surgeon:  Tennis Must, MD;  Location: [facility];  Service: Orthopedics;  Laterality: Left;  incision and drainage with percutaneous pinning left index finger       Current Outpatient Medications:    aspirin EC 81 MG tablet, Take 1 tablet (81 mg total) by mouth daily., Disp: 90 tablet, Rfl: 3   atorvastatin (LIPITOR) 20 MG tablet, Take 20 mg by mouth daily., Disp: , Rfl:    Cholecalciferol 1.25 MG (50000 UT) capsule, cholecalciferol (vitamin D3) 1,250 mcg (50,000 unit) capsule  Take 1 capsule every week by oral route.  Take 1 capsule weekly in the morning 30 minutes before food., Disp: , Rfl:    diltiazem (CARDIZEM) 30 MG tablet, May take as needed  30 mg  for rapid heart rate over 100. May repeat once after 30 minutes. Take no more than 4 tablets in 24 hours, Disp: 60 tablet, Rfl: 3   levothyroxine (SYNTHROID) 25 MCG tablet, Take 25 mcg by mouth daily., Disp: , Rfl:    metoprolol tartrate (LOPRESSOR) 25 MG tablet, Take 12.5 mg by mouth daily., Disp: , Rfl:    pantoprazole (PROTONIX) 40 MG tablet, Take 1 tablet (40 mg total) by mouth 2 (two) times daily., Disp: 180 tablet, Rfl: 3   sertraline (ZOLOFT) 50 MG tablet, Take 50 mg by mouth daily., Disp: , Rfl:     Physical Exam: Blood pressure 126/80, pulse (!) 46, height 5\' 11"  (1.803 m), weight 138 lb 6.4 oz (62.8 kg), SpO2 99 %.    Affect appropriate Healthy:  appears stated age 26: normal Neck supple with no adenopathy JVP normal no bruits no thyromegaly Lungs clear with no wheezing and good diaphragmatic motion Heart:  S1/S2 SEM  murmur, no rub, gallop or click PMI normal Abdomen: benighn, BS positve, no tenderness, no AAA no bruit.  No HSM or HJR Distal pulses intact with no bruits No edema Neuro non-focal Skin warm and dry No muscular weakness   Labs:   Lab Results  Component Value Date   WBC 4.4 05/13/2019   HGB 14.3 05/13/2019   HCT 43.2 05/13/2019   MCV 96.9 05/13/2019   PLT 247 05/13/2019      Radiology: No results found.  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

## 2021-03-16 ENCOUNTER — Ambulatory Visit (INDEPENDENT_AMBULATORY_CARE_PROVIDER_SITE_OTHER): Payer: Medicare Other | Admitting: Cardiovascular Disease

## 2021-03-16 ENCOUNTER — Other Ambulatory Visit: Payer: Self-pay

## 2021-03-16 ENCOUNTER — Encounter: Payer: Self-pay | Admitting: Cardiovascular Disease

## 2021-03-16 VITALS — BP 126/80 | HR 46 | Ht 71.0 in | Wt 138.4 lb

## 2021-03-16 DIAGNOSIS — I480 Paroxysmal atrial fibrillation: Secondary | ICD-10-CM | POA: Diagnosis not present

## 2021-03-16 DIAGNOSIS — I495 Sick sinus syndrome: Secondary | ICD-10-CM

## 2021-03-16 NOTE — Patient Instructions (Addendum)
Follow-Up: You have been referred to Dr. Lovena Le, [place].   Any Other Special Instructions Will Be Listed Below (If Applicable).     If you need a refill on your cardiac medications before your next appointment, please call your pharmacy.

## 2021-04-04 ENCOUNTER — Telehealth: Payer: Self-pay | Admitting: Internal Medicine

## 2021-04-04 ENCOUNTER — Ambulatory Visit (INDEPENDENT_AMBULATORY_CARE_PROVIDER_SITE_OTHER): Payer: Medicare Other | Admitting: Internal Medicine

## 2021-04-04 ENCOUNTER — Encounter: Payer: Self-pay | Admitting: Internal Medicine

## 2021-04-04 ENCOUNTER — Ambulatory Visit (INDEPENDENT_AMBULATORY_CARE_PROVIDER_SITE_OTHER): Payer: Medicare Other

## 2021-04-04 VITALS — BP 126/72 | HR 86 | Ht 71.0 in | Wt 139.0 lb

## 2021-04-04 DIAGNOSIS — I4891 Unspecified atrial fibrillation: Secondary | ICD-10-CM

## 2021-04-04 DIAGNOSIS — I495 Sick sinus syndrome: Secondary | ICD-10-CM

## 2021-04-04 NOTE — Progress Notes (Signed)
HPI Mr. Kent Davidson is referred today by Dr. 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 He has a h/o PAF and sinus brady. He has minimal symptoms from his atrial fib. He notes that at times his energy level is low. He has never had syncope. He denies edema. He was a heavy smoker but has cut back to 1/2 PPD. He does not have any documented vascular disease nor HTN. His CHADSVASC is 1.  Allergies  Allergen Reactions   Bee Venom Anaphylaxis     Current Outpatient Medications  Medication Sig Dispense Refill   aspirin EC 81 MG tablet Take 1 tablet (81 mg total) by mouth daily. 90 tablet 3   atorvastatin (LIPITOR) 20 MG tablet Take 20 mg by mouth daily.     Cholecalciferol 1.25 MG (50000 UT) capsule cholecalciferol (vitamin D3) 1,250 mcg (50,000 unit) capsule  Take 1 capsule every week by oral route.  Take 1 capsule weekly in the morning 30 minutes before food.     diltiazem (CARDIZEM) 30 MG tablet May take as needed  30 mg for rapid heart rate over 100. May repeat once after 30 minutes. Take no more than 4 tablets in 24 hours 60 tablet 3   levothyroxine (SYNTHROID) 25 MCG tablet Take 25 mcg by mouth daily.     metoprolol tartrate (LOPRESSOR) 25 MG tablet Take 12.5 mg by mouth daily.     pantoprazole (PROTONIX) 40 MG tablet Take 1 tablet (40 mg total) by mouth 2 (two) times daily. 180 tablet 3   sertraline (ZOLOFT) 50 MG tablet Take 50 mg by mouth daily.     No current facility-administered medications for this visit.     Past Medical History:  Diagnosis Date   Atrial fibrillation [facility])    Documented October 2020   Esophageal obstruction due to food impaction    October 2020   Gastroesophageal reflux     ROS:   All systems reviewed and negative except as noted in the HPI.   Past Surgical History:  Procedure Laterality Date   APPENDECTOMY     ESOPHAGEAL DILATION  11/19/2018   Procedure: ESOPHAGEAL DILATION;  Surgeon: Rogene Houston, MD;  [facility];   Service: Endoscopy;;   ESOPHAGOGASTRODUODENOSCOPY N/A 11/19/2018   Procedure: ESOPHAGOGASTRODUODENOSCOPY (EGD);  Surgeon: Rogene Houston, MD;  [facility];  Service: Endoscopy;  Laterality: N/A;   I & D EXTREMITY Right 10/03/2018   Procedure: IRRIGATION AND DEBRIDEMENT  RIGHT ULNA;  Surgeon: Carole Civil, MD;  [facility];  Service: Orthopedics;  Laterality: Right;   ORIF ULNAR FRACTURE Right 10/03/2018   Procedure: OPEN REDUCTION INTERNAL FIXATION (ORIF) RIGHT ULNAR FRACTURE;  Surgeon: Carole Civil, MD;  Location[facility];  Service: Orthopedics;  Laterality: Right;   TENDON REPAIR  11/13/2011   Procedure: TENDON REPAIR;  Surgeon: Tennis Must, MD;  Location: [place];  Service: Orthopedics;  Laterality: Left;  incision and drainage with percutaneous pinning left index finger      Family History  Problem Relation Age of Onset   Diabetes Mother    Hypertension Mother    Heart attack Mother      Social History   Socioeconomic History   Marital status: Divorced    Spouse name: Not on file   Number of children: Not on file   Years of education: Not on file   Highest education level: Not on file  Occupational History   Not on  file  Tobacco Use   Smoking status: Every Day    Packs/day: 0.50    Types: Cigarettes   Smokeless tobacco: Current    Types: Snuff  Vaping Use   Vaping Use: Never used  Substance and Sexual Activity   Alcohol use: No   Drug use: No   Sexual activity: Not on file  Other Topics Concern   Not on file  Social History Narrative   Not on file   Social Determinants of Health   Financial Resource Strain: Not on file  Food Insecurity: Not on file  Transportation Needs: Not on file  Physical Activity: Not on file  Stress: Not on file  Social Connections: Not on file  Intimate Partner Violence: Not on file     Ht 5\' 11"  (1.803 m)    Wt 139 lb (63 kg)    BMI 19.39 kg/m   Physical Exam:  diskempt  appearing NAD HEENT: Unremarkable Neck:  No JVD, no thyromegally Lymphatics:  No adenopathy Back:  No CVA tenderness Lungs:  Clear HEART:  IRegular rate rhythm, no murmurs, no rubs, no clicks Abd:  soft, positive bowel sounds, no organomegally, no rebound, no guarding Ext:  2 plus pulses, no edema, no cyanosis, no clubbing Skin:  No rashes no nodules Neuro:  CN II through XII intact, motor grossly intact  Assess/Plan:  Atrial fib - it is unclear how symptomatic he is. He will repeat the Zio monitor as it has been almost 2 years since the last. I suspect that he is now always in atrial fib. He will continue ASA. Tobacco abuse - he is encouraged to stop smoking.  Kent Davidson Kent Dais,MD

## 2021-04-04 NOTE — Patient Instructions (Addendum)
Medication Instructions:  Your physician recommends that you continue on your current medications as directed. Please refer to the Current Medication list given to you today.  *If you need a refill on your cardiac medications before your next appointment, please call your pharmacy*   Lab Work: NONE   If you have labs (blood work) drawn today and your tests are completely normal, you will receive your results only by: MyChart Message (if you have MyChart) OR A paper copy in the mail If you have any lab test that is abnormal or we need to change your treatment, we will call you to review the results.   Testing/Procedures: ZIO XT- Long Term Monitor Instructions   Your physician has requested you wear your ZIO patch monitor____7___days.   This is a single patch monitor.  [employer] supplies one patch monitor per enrollment.  Additional stickers are not available.   Please do not apply patch if you will be having a Nuclear Stress Test, Echocardiogram, Cardiac CT, MRI, or Chest Xray during the time frame you would be wearing the monitor. The patch cannot be worn during these tests.  You cannot remove and re-apply the ZIO XT patch monitor.   Your ZIO patch monitor will be sent USPS Priority mail from [employer] directly to your home address. The monitor may also be mailed to a PO BOX if home delivery is not available.   It may take 3-5 days to receive your monitor after you have been enrolled.   Once you have received you monitor, please review enclosed instructions.  Your monitor has already been registered assigning a specific monitor serial # to you.   Applying the monitor   Shave hair from upper left chest.   Hold abrader disc by orange tab.  Rub abrader in 40 strokes over left upper chest as indicated in your monitor instructions.   Clean area with 4 enclosed alcohol pads .  Use all pads to assure are is cleaned thoroughly.  Let dry.   Apply patch as indicated in monitor  instructions.  Patch will be place under collarbone on left side of chest with arrow pointing upward.   Rub patch adhesive wings for 2 minutes.Remove white label marked "1".  Remove white label marked "2".  Rub patch adhesive wings for 2 additional minutes.   While looking in a mirror, press and release button in center of patch.  A small green light will flash 3-4 times .  This will be your only indicator the monitor has been turned on.     Do not shower for the first 24 hours.  You may shower after the first 24 hours.   Press button if you feel a symptom. You will hear a small click.  Record Date, Time and Symptom in the Patient Log Book.   When you are ready to remove patch, follow instructions on last 2 pages of Patient Log Book.  Stick patch monitor onto last page of Patient Log Book.   Place Patient Log Book in Blue box.  Use locking tab on box and tape box closed securely.  The Orange and White box has prepaid postage on it.  Please place in mailbox as soon as possible.  Your physician should have your test results approximately 7 days after the monitor has been mailed back to [employer].   Call [employer] Customer Care at [phone] if you have questions regarding your ZIO XT patch monitor.  Call them immediately if you see an orange   light blinking on your monitor.   If your monitor falls off in less than 4 days contact our Monitor department at [phone].  If your monitor becomes loose or falls off after 4 days call [employer] at [phone] for suggestions on securing your monitor.     Follow-Up: At [facility], you and your health needs are our priority.  As part of our continuing mission to provide you with exceptional heart care, we have created designated Provider Care Teams.  These Care Teams include your primary Cardiologist (physician) and Advanced Practice Providers (APPs -  Physician Assistants and Nurse Practitioners) who all work together to provide you with  the care you need, when you need it.  We recommend signing up for the patient portal called "MyChart".  Sign up information is provided on this After Visit Summary.  MyChart is used to connect with patients for Virtual Visits (Telemedicine).  Patients are able to view lab/test results, encounter notes, upcoming appointments, etc.  Non-urgent messages can be sent to your provider as well.   To learn more about what you can do with MyChart, go to [email or web address].    Your next appointment:    Pending Monitor Results   The format for your next appointment:   In Person  Provider:   Gregg Taylor, MD   Other Instructions Thank you for choosing [facility]!    

## 2021-04-04 NOTE — Telephone Encounter (Signed)
PERCERT: ? ?LONG TERM MONITOR  ?

## 2021-04-17 ENCOUNTER — Telehealth: Payer: Self-pay | Admitting: Internal Medicine

## 2021-04-17 NOTE — Telephone Encounter (Signed)
Spoke to pt who stated that he felt pretty good while he wore his monitor. Pt stated that he did not feel like his heart was our of NSR and felt relatively good at the present time. I will fwd to provider as FYI.   ?

## 2021-04-17 NOTE — Telephone Encounter (Signed)
Received call from [facility].  Patient wore monitor from 04/04/21-04/10/21.  Showed afib 39% of time with HR at 189 for 60 seconds(triggered event) page 9 strip 5.  Also had 28 runs of SVT.  Report is available on line.  Will forward to nurse for review. ?

## 2021-04-17 NOTE — Telephone Encounter (Signed)
? ?  [name] calling to report abnormal ekg result ?

## 2021-04-19 ENCOUNTER — Other Ambulatory Visit: Payer: Self-pay

## 2021-04-19 ENCOUNTER — Emergency Department (HOSPITAL_COMMUNITY): Payer: No Typology Code available for payment source

## 2021-04-19 ENCOUNTER — Emergency Department (HOSPITAL_COMMUNITY)
Admission: EM | Admit: 2021-04-19 | Discharge: 2021-04-19 | Disposition: A | Discharge status: Home / Self Care | Payer: No Typology Code available for payment source | Attending: Emergency Medicine | Admitting: Emergency Medicine

## 2021-04-19 ENCOUNTER — Encounter (HOSPITAL_COMMUNITY): Payer: Self-pay

## 2021-04-19 DIAGNOSIS — R42 Dizziness and giddiness: Secondary | ICD-10-CM | POA: Insufficient documentation

## 2021-04-19 DIAGNOSIS — Z7982 Long term (current) use of aspirin: Secondary | ICD-10-CM | POA: Diagnosis not present

## 2021-04-19 DIAGNOSIS — Z79899 Other long term (current) drug therapy: Secondary | ICD-10-CM | POA: Diagnosis not present

## 2021-04-19 DIAGNOSIS — Z20822 Contact with and (suspected) exposure to covid-19: Secondary | ICD-10-CM | POA: Diagnosis not present

## 2021-04-19 LAB — CBC
HCT: 47.6 % (ref 39.0–52.0)
Hemoglobin: 15.2 g/dL (ref 13.0–17.0)
MCH: 31 pg (ref 26.0–34.0)
MCHC: 31.9 g/dL (ref 30.0–36.0)
MCV: 96.9 fL (ref 80.0–100.0)
Platelets: 178 10*3/uL (ref 150–400)
RBC: 4.91 MIL/uL (ref 4.22–5.81)
RDW: 14 % (ref 11.5–15.5)
WBC: 6.3 10*3/uL (ref 4.0–10.5)
nRBC: 0 % (ref 0.0–0.2)

## 2021-04-19 LAB — CBG MONITORING, ED: Glucose-Capillary: 96 mg/dL (ref 70–99)

## 2021-04-19 LAB — RESP PANEL BY RT-PCR (FLU A&B, COVID) ARPGX2
Influenza A by PCR: NEGATIVE
Influenza B by PCR: NEGATIVE
SARS Coronavirus 2 by RT PCR: NEGATIVE

## 2021-04-19 LAB — RAPID URINE DRUG SCREEN, HOSP PERFORMED
Amphetamines: NOT DETECTED
Barbiturates: NOT DETECTED
Benzodiazepines: NOT DETECTED
Cocaine: NOT DETECTED
Opiates: NOT DETECTED
Tetrahydrocannabinol: NOT DETECTED

## 2021-04-19 LAB — ETHANOL: Alcohol, Ethyl (B): <10 mg/dL (ref <10)

## 2021-04-19 LAB — BASIC METABOLIC PANEL
Anion gap: 12 (ref 5–15)
BUN: 14 mg/dL (ref 8–23)
CO2: 21 mmol/L — ABNORMAL LOW (ref 22–32)
Calcium: 9.1 mg/dL (ref 8.9–10.3)
Chloride: 103 mmol/L (ref 98–111)
Creatinine, Ser: 0.88 mg/dL (ref 0.61–1.24)
GFR, Estimated: >60 mL/min (ref >60)
Glucose, Bld: 99 mg/dL (ref 70–99)
Potassium: 3.8 mmol/L (ref 3.5–5.1)
Sodium: 136 mmol/L (ref 135–145)

## 2021-04-19 LAB — DIFFERENTIAL
Abs Immature Granulocytes: 0.02 10*3/uL (ref 0.00–0.07)
Basophils Absolute: 0.1 10*3/uL (ref 0.0–0.1)
Basophils Relative: 1 %
Eosinophils Absolute: 0.1 10*3/uL (ref 0.0–0.5)
Eosinophils Relative: 1 %
Immature Granulocytes: 0 %
Lymphocytes Relative: 18 %
Lymphs Abs: 1.1 10*3/uL (ref 0.7–4.0)
Monocytes Absolute: 0.4 10*3/uL (ref 0.1–1.0)
Monocytes Relative: 6 %
Neutro Abs: 4.5 10*3/uL (ref 1.7–7.7)
Neutrophils Relative %: 74 %

## 2021-04-19 LAB — URINALYSIS, ROUTINE W REFLEX MICROSCOPIC
Bilirubin Urine: NEGATIVE
Glucose, UA: NEGATIVE mg/dL
Hgb urine dipstick: NEGATIVE
Ketones, ur: NEGATIVE mg/dL
Leukocytes,Ua: NEGATIVE
Nitrite: NEGATIVE
Protein, ur: NEGATIVE mg/dL
Specific Gravity, Urine: 1.011 (ref 1.005–1.030)
pH: 7 (ref 5.0–8.0)

## 2021-04-19 LAB — PROTIME-INR
INR: 1 (ref 0.8–1.2)
Prothrombin Time: 12.9 seconds (ref 11.4–15.2)

## 2021-04-19 LAB — APTT: aPTT: 28 seconds (ref 24–36)

## 2021-04-19 MED ORDER — SODIUM CHLORIDE 0.9 % IV BOLUS
500.0000 mL | Freq: Once | INTRAVENOUS | Status: AC
  Administered 2021-04-19: 500 mL via INTRAVENOUS

## 2021-04-19 MED ORDER — SODIUM CHLORIDE 0.9 % IV SOLN
100.0000 mL/h | INTRAVENOUS | Status: DC
  Administered 2021-04-19: 100 mL/h via INTRAVENOUS

## 2021-04-19 NOTE — ED Triage Notes (Signed)
Patient complaining of not feeling well then became shaky and dizzy. This episode started around 8am this morning.  ?

## 2021-04-19 NOTE — Discharge Instructions (Addendum)
Your test today were reassuring.  No signs of acute infection.  No signs of anemia or severe dehydration.  The MRI did not show any signs of acute stroke but did show you ?

## 2021-04-19 NOTE — ED Provider Notes (Signed)
?[facility] ?Provider Note ? ? ?CSN: [id number] ?Arrival date & time: 04/19/21  1032 ? ?  ? ?History ? ?Chief Complaint  ?Patient presents with  ?? Dizziness  ? ? ?Kent Davidson is a 66 y.o. male. ? ? ?Dizziness ? ?Patient presents to the ED for evaluation of dizziness.  Patient states he was at work today when he suddenly started to feel unwell.  He felt very chilled and shaky and dizzy.  He also noticed his heart started to race.  He was not having any fevers that he is aware of.  He has not been coughing.  No urinary difficulty.  He left work and came to the hospital because of how he was feeling.  Patient states he was able to walk in but still feels off balance.  He is not having any headache.  No trouble with his speech.  No focal weakness ? ?Home Medications ?Prior to Admission medications   ?Medication Sig Start Date End Date Taking? Authorizing Provider  ?aspirin EC 81 MG tablet Take 1 tablet (81 mg total) by mouth daily. 12/19/18  Yes Satira Sark, MD  ?atorvastatin (LIPITOR) 20 MG tablet Take 20 mg by mouth daily. 01/31/21  Yes [provider]  ?levothyroxine (SYNTHROID) 25 MCG tablet Take 25 mcg by mouth daily. 01/31/21  Yes [provider]  ?metoprolol tartrate (LOPRESSOR) 25 MG tablet Take 12.5 mg by mouth daily. 01/31/21  Yes [provider]  ?pantoprazole (PROTONIX) 40 MG tablet Take 1 tablet (40 mg total) by mouth 2 (two) times daily. 09/29/19  Yes Strader, Fransisco Hertz, PA-C  ?sertraline (ZOLOFT) 50 MG tablet Take 50 mg by mouth daily. 01/31/21  Yes [provider]  ?Cholecalciferol 1.25 MG (50000 UT) capsule cholecalciferol (vitamin D3) 1,250 mcg (50,000 unit) capsule ? Take 1 capsule every week by oral route. ? Take 1 capsule weekly in the morning 30 minutes before food. ?Patient not taking: Reported on 04/19/2021    [provider]  ?diltiazem (CARDIZEM) 30 MG tablet May take as needed  30 mg for rapid heart rate over  100. May repeat once after 30 minutes. Take no more than 4 tablets in 24 hours ?Patient not taking: Reported on 04/19/2021 06/26/19   Satira Sark, MD  ?   ? ?Allergies    ?Bee venom   ? ?Review of Systems   ?Review of Systems  ?Neurological:  Positive for dizziness.  ? ?Physical Exam ?Updated Vital Signs ?BP 129/88   Pulse 72   Temp 97.6 ?F (36.4 ?C) (Oral)   Resp 17   Ht 1.803 m ('5\' 11"'$ )   Wt 63 kg   SpO2 97%   BMI 19.39 kg/m?  ?Physical Exam ?Vitals and nursing note reviewed.  ?Constitutional:   ?   General: He is not in acute distress. ?   Appearance: He is well-developed.  ?HENT:  ?   Head: Normocephalic and atraumatic.  ?   Right Ear: External ear normal.  ?   Left Ear: External ear normal.  ?Eyes:  ?   General: No scleral icterus.    ?   Right eye: No discharge.     ?   Left eye: No discharge.  ?   Conjunctiva/sclera: Conjunctivae normal.  ?Neck:  ?   Trachea: No tracheal deviation.  ?Cardiovascular:  ?   Rate and Rhythm: Normal rate and regular rhythm.  ?Pulmonary:  ?   Effort: Pulmonary effort is normal. No respiratory distress.  ?  Breath sounds: Normal breath sounds. No stridor. No wheezing or rales.  ?Abdominal:  ?   General: Bowel sounds are normal. There is no distension.  ?   Palpations: Abdomen is soft.  ?   Tenderness: There is no abdominal tenderness. There is no guarding or rebound.  ?Musculoskeletal:     ?   General: No tenderness.  ?   Cervical back: Neck supple.  ?Skin: ?   General: Skin is warm and dry.  ?   Findings: No rash.  ?Neurological:  ?   Mental Status: He is alert and oriented to person, place, and time.  ?   Cranial Nerves: No cranial nerve deficit.  ?   Sensory: No sensory deficit.  ?   Motor: No abnormal muscle tone or seizure activity.  ?   Coordination: Coordination normal.  ?   Comments: No pronator drift bilateral upper extrem, able to hold both legs off bed for 5 seconds, sensation intact in all extremities, no visual field cuts, no left or right sided neglect,  slight difficulty with finger-nose exam , no nystagmus noted ? ?No facial droop, extraocular movements intact, tongue midline  ? ? ?ED Results / Procedures / Treatments   ?Labs ?(all labs ordered are listed, but only abnormal results are displayed) ?Labs Reviewed  ?BASIC METABOLIC PANEL - Abnormal; Notable for the following components:  ?    Result Value  ? CO2 21 (*)   ? All other components within normal limits  ?URINALYSIS, ROUTINE W REFLEX MICROSCOPIC - Abnormal; Notable for the following components:  ? Color, Urine STRAW (*)   ? All other components within normal limits  ?RESP PANEL BY RT-PCR (FLU A&B, COVID) ARPGX2  ?CBC  ?ETHANOL  ?PROTIME-INR  ?APTT  ?DIFFERENTIAL  ?RAPID URINE DRUG SCREEN, HOSP PERFORMED  ?CBG MONITORING, ED  ?I-STAT CHEM 8, ED  ? ? ?EKG ?EKG Interpretation ? ?Date/Time:  Wednesday April 19 2021 11:06:02 EDT ?Ventricular Rate:  85 ?PR Interval:    ?QRS Duration: 88 ?QT Interval:  386 ?QTC Calculation: 459 ?R Axis:   78 ?Text Interpretation: Atrial fibrillation T wave abnormality, consider anterior ischemia Abnormal ECG When compared with ECG of 10-May-2019 11:54, Since last tracing rate faster Confirmed by Dorie Rank [phone]) on 04/19/2021 11:30:42 AM ? ?Radiology ?CT HEAD WO CONTRAST ? ?Result Date: 04/19/2021 ?CLINICAL DATA:  Dizziness and malaise. EXAM: CT HEAD WITHOUT CONTRAST TECHNIQUE: Contiguous axial images were obtained from the base of the skull through the vertex without intravenous contrast. RADIATION DOSE REDUCTION: This exam was performed according to the departmental dose-optimization program which includes automated exposure control, adjustment of the mA and/or kV according to patient size and/or use of iterative reconstruction technique. COMPARISON:  CT head dated May 10, 2019. FINDINGS: Brain: No evidence of acute infarction, hemorrhage, hydrocephalus, extra-axial collection or mass lesion/mass effect. Vascular: Atherosclerotic vascular calcification of the carotid siphons.  No hyperdense vessel. Skull: Normal. Negative for fracture or focal lesion. Sinuses/Orbits: No acute finding. Other: None. IMPRESSION: 1. No acute intracranial abnormality. Electronically Signed   By: Titus Dubin M.D.   On: 04/19/2021 12:51  ? ?MR BRAIN WO CONTRAST ? ?Result Date: 04/19/2021 ?CLINICAL DATA:  Neuro deficit, acute, stroke suspected. Patient complains of dizziness and not feeling well since waking today. EXAM: MRI HEAD WITHOUT CONTRAST TECHNIQUE: Multiplanar, multiecho pulse sequences of the brain and surrounding structures were obtained without intravenous contrast. COMPARISON:  CT head without contrast 04/19/2021 FINDINGS: Brain: No acute infarct, hemorrhage, or mass lesion is present.  Areas of cortical T2 hyperintensity are present in the left temporal and parietal lobe on images 15-18 of series 12. Susceptibility changes are noted in the same area. No other focal cortical abnormality is present. White matter is unremarkable. Basal ganglia are within normal limits. The internal auditory canals are within normal limits. The brainstem and cerebellum are within normal limits. Vascular: Flow is present in the major intracranial arteries. Skull and upper cervical spine: The craniocervical junction is normal. Upper cervical spine is within normal limits. Marrow signal is unremarkable. Sinuses/Orbits: The paranasal sinuses and mastoid air cells are clear. The globes and orbits are within normal limits. IMPRESSION: 1. No acute intracranial abnormality. 2. 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 Electronically Signed   By: San Morelle M.D.   On: 04/19/2021 15:54   ? ?Procedures ?.1-3 Lead EKG Interpretation ?Performed by: Dorie Rank, MD ?Authorized by: Dorie Rank, MD  ? ?  Interpretation: abnormal   ?  ECG rate:  90 ?  ECG rate assessment: normal   ?  Rhythm: atrial fibrillation   ?  Ectopy: none   ?  Conduction: normal   ?Comments:  ?   12:23 PM   ? ? ?Medications  Ordered in ED ?Medications  ?sodium chloride 0.9 % bolus 500 mL (500 mLs Intravenous New Bag/Given 04/19/21 1304)  ?  Followed by  ?0.9 %  sodium chloride infusion (100 mL/hr Intravenous New Bag/Given 04/19/21 1305

## 2021-04-20 NOTE — Telephone Encounter (Signed)
He needs to start eliquis '5mg'$  daily. GT ?

## 2021-04-21 NOTE — Telephone Encounter (Signed)
Please clarify frequency of Eliquis 5 mg.   ?

## 2021-04-25 NOTE — Telephone Encounter (Signed)
Pt notified and voiced understanding 

## 2021-04-25 NOTE — Telephone Encounter (Signed)
Per Dr. Lovena Le "Actually I researched him and he does not need systemic anti-coag as CHADSVASC2 score is 1. Watchful waiting. " ?

## 2021-04-26 ENCOUNTER — Telehealth: Payer: Self-pay

## 2021-04-26 MED ORDER — FLECAINIDE ACETATE 50 MG PO TABS: 50.0000 mg | ORAL_TABLET | Freq: Two times a day (BID) | ORAL | 3 refills | Status: AC

## 2021-04-26 NOTE — Telephone Encounter (Signed)
I spoke with patient and he will start Flecainide 50 mg twice a day. 2 week nurse visit is 05/10/21 at 4:15 pm, Dr.Taylor f/u 06/08/21 at 11 am ?

## 2021-05-03 DIAGNOSIS — R7301 Impaired fasting glucose: Secondary | ICD-10-CM | POA: Diagnosis not present

## 2021-05-03 DIAGNOSIS — E559 Vitamin D deficiency, unspecified: Secondary | ICD-10-CM | POA: Diagnosis not present

## 2021-05-03 DIAGNOSIS — E039 Hypothyroidism, unspecified: Secondary | ICD-10-CM | POA: Diagnosis not present

## 2021-05-10 ENCOUNTER — Ambulatory Visit: Payer: Medicare Other

## 2021-05-17 DIAGNOSIS — E782 Mixed hyperlipidemia: Secondary | ICD-10-CM | POA: Diagnosis not present

## 2021-05-17 DIAGNOSIS — R001 Bradycardia, unspecified: Secondary | ICD-10-CM | POA: Diagnosis not present

## 2021-05-17 DIAGNOSIS — E039 Hypothyroidism, unspecified: Secondary | ICD-10-CM | POA: Diagnosis not present

## 2021-05-17 DIAGNOSIS — E559 Vitamin D deficiency, unspecified: Secondary | ICD-10-CM | POA: Diagnosis not present

## 2021-05-17 DIAGNOSIS — R079 Chest pain, unspecified: Secondary | ICD-10-CM | POA: Diagnosis not present

## 2021-05-24 ENCOUNTER — Encounter (HOSPITAL_COMMUNITY): Payer: Self-pay | Admitting: Emergency Medicine

## 2021-05-24 ENCOUNTER — Emergency Department (HOSPITAL_COMMUNITY): Payer: Medicare Other | Admitting: Anesthesiology

## 2021-05-24 ENCOUNTER — Emergency Department (HOSPITAL_BASED_OUTPATIENT_CLINIC_OR_DEPARTMENT_OTHER): Payer: Medicare Other | Admitting: Anesthesiology

## 2021-05-24 ENCOUNTER — Emergency Department (HOSPITAL_COMMUNITY): Payer: Medicare Other

## 2021-05-24 ENCOUNTER — Other Ambulatory Visit: Payer: Self-pay

## 2021-05-24 ENCOUNTER — Encounter (HOSPITAL_COMMUNITY)
Admission: EM | Disposition: A | Discharge status: Home / Self Care | Payer: Self-pay | Source: Home / Self Care | Attending: Emergency Medicine

## 2021-05-24 ENCOUNTER — Observation Stay (HOSPITAL_COMMUNITY)
Admission: EM | Admit: 2021-05-24 | Discharge: 2021-05-25 | Disposition: A | Discharge status: Home / Self Care | Payer: Medicare Other | Attending: Family Medicine | Admitting: Family Medicine

## 2021-05-24 DIAGNOSIS — K224 Dyskinesia of esophagus: Secondary | ICD-10-CM | POA: Insufficient documentation

## 2021-05-24 DIAGNOSIS — Z79899 Other long term (current) drug therapy: Secondary | ICD-10-CM | POA: Insufficient documentation

## 2021-05-24 DIAGNOSIS — I4891 Unspecified atrial fibrillation: Secondary | ICD-10-CM | POA: Diagnosis present

## 2021-05-24 DIAGNOSIS — K2289 Other specified disease of esophagus: Secondary | ICD-10-CM | POA: Diagnosis not present

## 2021-05-24 DIAGNOSIS — K222 Esophageal obstruction: Secondary | ICD-10-CM

## 2021-05-24 DIAGNOSIS — I480 Paroxysmal atrial fibrillation: Secondary | ICD-10-CM | POA: Diagnosis not present

## 2021-05-24 DIAGNOSIS — K2100 Gastro-esophageal reflux disease with esophagitis, without bleeding: Secondary | ICD-10-CM

## 2021-05-24 DIAGNOSIS — I10 Essential (primary) hypertension: Secondary | ICD-10-CM | POA: Diagnosis not present

## 2021-05-24 DIAGNOSIS — R001 Bradycardia, unspecified: Secondary | ICD-10-CM | POA: Diagnosis not present

## 2021-05-24 DIAGNOSIS — X58XXXA Exposure to other specified factors, initial encounter: Secondary | ICD-10-CM | POA: Insufficient documentation

## 2021-05-24 DIAGNOSIS — Z7982 Long term (current) use of aspirin: Secondary | ICD-10-CM | POA: Diagnosis not present

## 2021-05-24 DIAGNOSIS — F17210 Nicotine dependence, cigarettes, uncomplicated: Secondary | ICD-10-CM | POA: Diagnosis not present

## 2021-05-24 DIAGNOSIS — R079 Chest pain, unspecified: Secondary | ICD-10-CM | POA: Diagnosis not present

## 2021-05-24 DIAGNOSIS — R1310 Dysphagia, unspecified: Secondary | ICD-10-CM

## 2021-05-24 DIAGNOSIS — T18108A Unspecified foreign body in esophagus causing other injury, initial encounter: Secondary | ICD-10-CM | POA: Diagnosis not present

## 2021-05-24 DIAGNOSIS — T18128A Food in esophagus causing other injury, initial encounter: Principal | ICD-10-CM

## 2021-05-24 HISTORY — PX: ESOPHAGOGASTRODUODENOSCOPY (EGD) WITH PROPOFOL: SHX5813

## 2021-05-24 HISTORY — PX: ESOPHAGEAL DILATION: SHX303

## 2021-05-24 HISTORY — PX: FOREIGN BODY REMOVAL: SHX962

## 2021-05-24 LAB — COMPREHENSIVE METABOLIC PANEL
ALT: 25 U/L (ref 0–44)
AST: 32 U/L (ref 15–41)
Albumin: 4.3 g/dL (ref 3.5–5.0)
Alkaline Phosphatase: 49 U/L (ref 38–126)
Anion gap: 9 (ref 5–15)
BUN: 28 mg/dL — ABNORMAL HIGH (ref 8–23)
CO2: 25 mmol/L (ref 22–32)
Calcium: 9.2 mg/dL (ref 8.9–10.3)
Chloride: 105 mmol/L (ref 98–111)
Creatinine, Ser: 1.05 mg/dL (ref 0.61–1.24)
GFR, Estimated: >60 mL/min (ref >60)
Glucose, Bld: 87 mg/dL (ref 70–99)
Potassium: 3.9 mmol/L (ref 3.5–5.1)
Sodium: 139 mmol/L (ref 135–145)
Total Bilirubin: 0.5 mg/dL (ref 0.3–1.2)
Total Protein: 7.1 g/dL (ref 6.5–8.1)

## 2021-05-24 LAB — CBC WITH DIFFERENTIAL/PLATELET
Abs Immature Granulocytes: 0.01 10*3/uL (ref 0.00–0.07)
Basophils Absolute: 0.1 10*3/uL (ref 0.0–0.1)
Basophils Relative: 1 %
Eosinophils Absolute: 0 10*3/uL (ref 0.0–0.5)
Eosinophils Relative: 1 %
HCT: 45.9 % (ref 39.0–52.0)
Hemoglobin: 14.8 g/dL (ref 13.0–17.0)
Immature Granulocytes: 0 %
Lymphocytes Relative: 20 %
Lymphs Abs: 1.2 10*3/uL (ref 0.7–4.0)
MCH: 30.8 pg (ref 26.0–34.0)
MCHC: 32.2 g/dL (ref 30.0–36.0)
MCV: 95.6 fL (ref 80.0–100.0)
Monocytes Absolute: 0.5 10*3/uL (ref 0.1–1.0)
Monocytes Relative: 8 %
Neutro Abs: 4.2 10*3/uL (ref 1.7–7.7)
Neutrophils Relative %: 70 %
Platelets: 202 10*3/uL (ref 150–400)
RBC: 4.8 MIL/uL (ref 4.22–5.81)
RDW: 13.4 % (ref 11.5–15.5)
WBC: 6 10*3/uL (ref 4.0–10.5)
nRBC: 0 % (ref 0.0–0.2)

## 2021-05-24 LAB — LIPASE, BLOOD: Lipase: 41 U/L (ref 11–51)

## 2021-05-24 LAB — TROPONIN I (HIGH SENSITIVITY)
Troponin I (High Sensitivity): 22 ng/L — ABNORMAL HIGH (ref <18)
Troponin I (High Sensitivity): 20 ng/L — ABNORMAL HIGH (ref <18)
Troponin I (High Sensitivity): 21 ng/L — ABNORMAL HIGH (ref <18)

## 2021-05-24 SURGERY — ESOPHAGOGASTRODUODENOSCOPY (EGD) WITH PROPOFOL
Anesthesia: General

## 2021-05-24 MED ORDER — GLUCAGON HCL RDNA (DIAGNOSTIC) 1 MG IJ SOLR
1.0000 mg | Freq: Once | INTRAMUSCULAR | Status: AC
  Administered 2021-05-24: 1 mg via INTRAVENOUS
  Filled 2021-05-24: qty 1

## 2021-05-24 MED ORDER — SODIUM CHLORIDE 0.9 % IV SOLN: INTRAVENOUS | Status: DC

## 2021-05-24 MED ORDER — ONDANSETRON HCL 4 MG/2ML IJ SOLN: 4.0000 mg | Freq: Four times a day (QID) | INTRAMUSCULAR | Status: DC | PRN

## 2021-05-24 MED ORDER — SUCCINYLCHOLINE 20MG/ML (10ML) SYRINGE FOR MEDFUSION PUMP - OPTIME
INTRAMUSCULAR | Status: DC | PRN
  Administered 2021-05-24: 80 mg via INTRAVENOUS

## 2021-05-24 MED ORDER — LACTATED RINGERS IV SOLN: INTRAVENOUS | Status: DC | PRN

## 2021-05-24 MED ORDER — ROCURONIUM 10MG/ML (10ML) SYRINGE FOR MEDFUSION PUMP - OPTIME
INTRAVENOUS | Status: DC | PRN
Start: 2021-05-24 — End: 2021-05-24
  Administered 2021-05-24: 20 mg via INTRAVENOUS

## 2021-05-24 MED ORDER — FLECAINIDE ACETATE 50 MG PO TABS
50.0000 mg | ORAL_TABLET | Freq: Two times a day (BID) | ORAL | Status: DC
Start: 2021-05-24 — End: 2021-05-25
  Administered 2021-05-25 (×2): 50 mg via ORAL
  Filled 2021-05-24 (×4): qty 1

## 2021-05-24 MED ORDER — ONDANSETRON HCL 4 MG PO TABS: 4.0000 mg | ORAL_TABLET | Freq: Four times a day (QID) | ORAL | Status: DC | PRN

## 2021-05-24 MED ORDER — POLYETHYLENE GLYCOL 3350 17 G PO PACK: 17.0000 g | PACK | Freq: Every day | ORAL | Status: DC | PRN

## 2021-05-24 MED ORDER — GLYCOPYRROLATE 0.2 MG/ML IJ SOLN
INTRAMUSCULAR | Status: DC | PRN
  Administered 2021-05-24: .2 mg via INTRAVENOUS

## 2021-05-24 MED ORDER — FENTANYL CITRATE (PF) 100 MCG/2ML IJ SOLN
INTRAMUSCULAR | Status: AC
Start: 2021-05-24 — End: ?
  Filled 2021-05-24: qty 2

## 2021-05-24 MED ORDER — SERTRALINE HCL 50 MG PO TABS
50.0000 mg | ORAL_TABLET | Freq: Every day | ORAL | Status: DC
  Administered 2021-05-25: 50 mg via ORAL
  Filled 2021-05-24: qty 1

## 2021-05-24 MED ORDER — ACETAMINOPHEN 325 MG PO TABS: 650.0000 mg | ORAL_TABLET | Freq: Four times a day (QID) | ORAL | Status: DC | PRN

## 2021-05-24 MED ORDER — NICOTINE 21 MG/24HR TD PT24
21.0000 mg | MEDICATED_PATCH | Freq: Every day | TRANSDERMAL | Status: DC
  Administered 2021-05-24 – 2021-05-25 (×2): 21 mg via TRANSDERMAL
  Filled 2021-05-24 (×2): qty 1

## 2021-05-24 MED ORDER — ACETAMINOPHEN 650 MG RE SUPP: 650.0000 mg | Freq: Four times a day (QID) | RECTAL | Status: DC | PRN

## 2021-05-24 MED ORDER — LEVOTHYROXINE SODIUM 75 MCG PO TABS
75.0000 ug | ORAL_TABLET | Freq: Every day | ORAL | Status: DC
  Administered 2021-05-25: 75 ug via ORAL
  Filled 2021-05-24: qty 1

## 2021-05-24 MED ORDER — LIDOCAINE HCL (CARDIAC) PF 50 MG/5ML IV SOSY
PREFILLED_SYRINGE | INTRAVENOUS | Status: DC | PRN
  Administered 2021-05-24: 50 mg via INTRAVENOUS

## 2021-05-24 MED ORDER — PROPOFOL 10 MG/ML IV BOLUS
INTRAVENOUS | Status: DC | PRN
  Administered 2021-05-24: 50 mg via INTRAVENOUS
  Administered 2021-05-24: 100 mg via INTRAVENOUS

## 2021-05-24 MED ORDER — FENTANYL CITRATE (PF) 100 MCG/2ML IJ SOLN
INTRAMUSCULAR | Status: DC | PRN
  Administered 2021-05-24 (×2): 50 ug via INTRAVENOUS

## 2021-05-24 MED ORDER — PANTOPRAZOLE SODIUM 40 MG IV SOLR
40.0000 mg | Freq: Two times a day (BID) | INTRAVENOUS | Status: DC
Start: 2021-05-24 — End: 2021-05-25
  Administered 2021-05-24 – 2021-05-25 (×2): 40 mg via INTRAVENOUS
  Filled 2021-05-24 (×2): qty 10

## 2021-05-24 NOTE — Anesthesia Preprocedure Evaluation (Signed)
Anesthesia Evaluation  ?Patient identified by MRN, date of birth, ID band ?Patient awake ? ? ? ?Reviewed: ?Allergy & Precautions, H&P , NPO status , Patient's Chart, lab work & pertinent test results, reviewed documented beta blocker date and time  ? ?Airway ?Mallampati: II ? ?TM Distance: >3 FB ?Neck ROM: full ? ? ? Dental ?no notable dental hx. ? ?  ?Pulmonary ?neg pulmonary ROS, Current Smoker,  ?  ?Pulmonary exam normal ?breath sounds clear to auscultation ? ? ? ? ? ? Cardiovascular ?Exercise Tolerance: Good ?hypertension, + dysrhythmias (Bradycardia - stable)  ?Rhythm:regular Rate:Normal ? ? ?  ?Neuro/Psych ?negative neurological ROS ? negative psych ROS  ? GI/Hepatic ?Neg liver ROS, GERD  Medicated,  ?Endo/Other  ?negative endocrine ROS ? Renal/GU ?negative Renal ROS  ?negative genitourinary ?  ?Musculoskeletal ? ? Abdominal ?  ?Peds ? Hematology ?negative hematology ROS ?(+)   ?Anesthesia Other Findings ?On arrival to ED, troponin was very mildly elevated.  Discussed decision-making process with Dr. Laural Golden.  The pain was atypical (right shoulder) and more likely related to the food bolus than any cardiac condition.  The elevated troponin was more likely related to the same.  Even if anticoagulation or intervention was needed, the food bolus needs to be cleared first so we will proceed. ? Reproductive/Obstetrics ?negative OB ROS ? ?  ? ? ? ? ? ? ? ? ? ? ? ? ? ?  ?  ? ? ? ? ? ? ? ? ?Anesthesia Physical ?Anesthesia Plan ? ?ASA: 4 and emergent ? ?Anesthesia Plan: General, General ETT and Rapid Sequence  ? ?Post-op Pain Management:   ? ?Induction:  ? ?PONV Risk Score and Plan: Ondansetron ? ?Airway Management Planned:  ? ?Additional Equipment:  ? ?Intra-op Plan:  ? ?Post-operative Plan:  ? ?Informed Consent: I have reviewed the patients History and Physical, chart, labs and discussed the procedure including the risks, benefits and alternatives for the proposed anesthesia with the  patient or authorized representative who has indicated his/her understanding and acceptance.  ? ? ? ?Dental Advisory Given ? ?Plan Discussed with: CRNA ? ?Anesthesia Plan Comments:   ? ? ? ? ? ? ?Anesthesia Quick Evaluation ? ?

## 2021-05-24 NOTE — Assessment & Plan Note (Signed)
Stable. ?-Medication. ?

## 2021-05-24 NOTE — Consult Note (Signed)
Referring Provider: Kem Parkinson, PA-C/emergency room ?Primary Care Physician:  Celene Squibb, MD ?Primary Gastroenterologist:  Dr. Laural Golden ? ?Reason for Consultation:   ? ?Foreign body esophagus ? ?HPI:  ? ?Patient is 66 year old Caucasian male who presented to emergency room earlier today with complaints of an inability to swallow liquids or solids.  Patient has a history of dysphagia.  He underwent esophagogastroduodenoscopy with foreign body removal and esophageal dilation in October 2020.  He did not return for follow-up visit.  That exam suggested dysplastic segment. ?Patient states he has been having dysphagia sporadically.  He has had few episodes relieved spontaneously with regurgitation. ?Present symptoms started over the weekend when he was eating barbecue.  He states he has not been able to eat or drink anything since then.  He has been experiencing retrosternal pain radiating to his right shoulder but he has not had any shortness of breath or palpitations.  Patient states that he has been eating well but he has been losing weight.  He states he has lost 30 to 40 pounds in the last 2 to 3 years.  He follows up with Dr. Delphina Cahill.  He has not brought this to his attention.  He has heartburn no more than once or twice a week with certain foods.  He denies hematemesis melena or rectal bleeding. ?Patient states chest and right shoulder pain has resolved. ?He does not take anticoagulants anymore. ?He smokes half of cigarettes per day.  He does not drink alcohol. ? ?Past Medical History:  ?Diagnosis Date  ? Atrial fibrillation ([place])   ? Documented October 2020  ? Esophageal obstruction due to food impaction   ? October 2020  ? Gastroesophageal reflux   ? ? ?Past Surgical History:  ?Procedure Laterality Date  ? APPENDECTOMY    ? ESOPHAGEAL DILATION  11/19/2018  ? Procedure: ESOPHAGEAL DILATION;  Surgeon: Rogene Houston, MD;  Location: [facility];  Service: Endoscopy;;  ? ESOPHAGOGASTRODUODENOSCOPY N/A  11/19/2018  ? Procedure: ESOPHAGOGASTRODUODENOSCOPY (EGD);  Surgeon: Rogene Houston, MD;  Location: [facility];  Service: Endoscopy;  Laterality: N/A;  ? I & D EXTREMITY Right 10/03/2018  ? Procedure: IRRIGATION AND DEBRIDEMENT  RIGHT ULNA;  Surgeon: Carole Civil, MD;  Location: [facility];  Service: Orthopedics;  Laterality: Right;  ? ORIF ULNAR FRACTURE Right 10/03/2018  ? Procedure: OPEN REDUCTION INTERNAL FIXATION (ORIF) RIGHT ULNAR FRACTURE;  Surgeon: Carole Civil, MD;  Location: [facility];  Service: Orthopedics;  Laterality: Right;  ? TENDON REPAIR  11/13/2011  ? Procedure: TENDON REPAIR;  Surgeon: Tennis Must, MD;  Location: [facility];  Service: Orthopedics;  Laterality: Left;  incision and drainage with percutaneous pinning left index finger   ? ? ?Prior to Admission medications   ?Medication Sig Start Date End Date Taking? Authorizing Provider  ?aspirin EC 81 MG tablet Take 1 tablet (81 mg total) by mouth daily. 12/19/18   Satira Sark, MD  ?atorvastatin (LIPITOR) 20 MG tablet Take 20 mg by mouth daily. 01/31/21   [provider]  ?Cholecalciferol 1.25 MG (50000 UT) capsule cholecalciferol (vitamin D3) 1,250 mcg (50,000 unit) capsule ? Take 1 capsule every week by oral route. ? Take 1 capsule weekly in the morning 30 minutes before food. ?Patient not taking: Reported on 04/19/2021    [provider]  ?diltiazem (CARDIZEM) 30 MG tablet May take as needed  30 mg for rapid heart rate over 100. May repeat once after 30 minutes. Take  no more than 4 tablets in 24 hours ?Patient not taking: Reported on 04/19/2021 06/26/19   Satira Sark, MD  ?flecainide (TAMBOCOR) 50 MG tablet Take 1 tablet (50 mg total) by mouth 2 (two) times daily. 04/26/21   Evans Lance, MD  ?levothyroxine (SYNTHROID) 25 MCG tablet Take 25 mcg by mouth daily. 01/31/21   [provider]  ?metoprolol tartrate (LOPRESSOR) 25 MG tablet Take 12.5 mg by mouth daily. 01/31/21    [provider]  ?pantoprazole (PROTONIX) 40 MG tablet Take 1 tablet (40 mg total) by mouth 2 (two) times daily. 09/29/19   Strader, Fransisco Hertz, PA-C  ?sertraline (ZOLOFT) 50 MG tablet Take 50 mg by mouth daily. 01/31/21   [provider]  ? ? ?No current facility-administered medications for this encounter.  ? ?Current Outpatient Medications  ?Medication Sig Dispense Refill  ? aspirin EC 81 MG tablet Take 1 tablet (81 mg total) by mouth daily. 90 tablet 3  ? atorvastatin (LIPITOR) 20 MG tablet Take 20 mg by mouth daily.    ? Cholecalciferol 1.25 MG (50000 UT) capsule cholecalciferol (vitamin D3) 1,250 mcg (50,000 unit) capsule ? Take 1 capsule every week by oral route. ? Take 1 capsule weekly in the morning 30 minutes before food. (Patient not taking: Reported on 04/19/2021)    ? diltiazem (CARDIZEM) 30 MG tablet May take as needed  30 mg for rapid heart rate over 100. May repeat once after 30 minutes. Take no more than 4 tablets in 24 hours (Patient not taking: Reported on 04/19/2021) 60 tablet 3  ? flecainide (TAMBOCOR) 50 MG tablet Take 1 tablet (50 mg total) by mouth 2 (two) times daily. 180 tablet 3  ? levothyroxine (SYNTHROID) 25 MCG tablet Take 25 mcg by mouth daily.    ? metoprolol tartrate (LOPRESSOR) 25 MG tablet Take 12.5 mg by mouth daily.    ? pantoprazole (PROTONIX) 40 MG tablet Take 1 tablet (40 mg total) by mouth 2 (two) times daily. 180 tablet 3  ? sertraline (ZOLOFT) 50 MG tablet Take 50 mg by mouth daily.    ? ? ?Allergies as of 05/24/2021 - Review Complete 05/24/2021  ?Allergen Reaction Noted  ? Bee venom Anaphylaxis 11/13/2011  ? ? ?Family History  ?Problem Relation Age of Onset  ? Diabetes Mother   ? Hypertension Mother   ? Heart attack Mother   ? ? ?Social History  ? ?Socioeconomic History  ? Marital status: Divorced  ?  Spouse name: Not on file  ? Number of children: Not on file  ? Years of education: Not on file  ? Highest education level: Not on file  ?Occupational History   ? Not on file  ?Tobacco Use  ? Smoking status: Every Day  ?  Packs/day: 0.50  ?  Types: Cigarettes  ? Smokeless tobacco: Current  ?  Types: Snuff  ?Vaping Use  ? Vaping Use: Never used  ?Substance and Sexual Activity  ? Alcohol use: No  ? Drug use: No  ? Sexual activity: Not on file  ?Other Topics Concern  ? Not on file  ?Social History Narrative  ? Not on file  ? ?Social Determinants of Health  ? ?Financial Resource Strain: Not on file  ?Food Insecurity: Not on file  ?Transportation Needs: Not on file  ?Physical Activity: Not on file  ?Stress: Not on file  ?Social Connections: Not on file  ?Intimate Partner Violence: Not on file  ? ? ?Review of Systems: ?See HPI, otherwise  normal ROS ? ?Physical Exam: ?Temp:  [97.6 ?F (36.4 ?C)] 97.6 ?F (36.4 ?C) (04/19 1004) ?Pulse Rate:  [38-65] 38 (04/19 1300) ?Resp:  [11-18] 15 (04/19 1300) ?BP: (115-137)/(78-89) 133/89 (04/19 1300) ?SpO2:  [99 %-100 %] 100 % (04/19 1300) ?Weight:  [63 kg] 63 kg (04/19 1007) ?  ?Well-developed thin Caucasian male in no acute distress.   ?He is holding onto an emesis container which is empty. ?Conjunctiva is pink.  Sclera is nonicteric. ?Oropharyngeal mucosa is normal.  He has upper dentures in place.  He is edentulous and lower jaw. ?No neck masses thyromegaly or lymphadenopathy noted. ?Cardiac exam with regular rhythm normal S1 and S2.  No murmur or gallop noted. ?Auscultation lungs reveal vesicular breath sounds bilaterally. ?Abdomen is flat and soft with mild tenderness below the right costal margin. ?No peripheral edema or clubbing noted. ? ?Intake/Output from previous day: ?No intake/output data recorded. ?Intake/Output this shift: ?No intake/output data recorded. ? ?Lab Results: ?Recent Labs  ?  05/24/21 ?1113  ?WBC 6.0  ?HGB 14.8  ?HCT 45.9  ?PLT 202  ? ?BMET ?Recent Labs  ?  05/24/21 ?1113  ?NA 139  ?K 3.9  ?CL 105  ?CO2 25  ?GLUCOSE 87  ?BUN 28*  ?CREATININE 1.05  ?CALCIUM 9.2  ? ?LFT ?Recent Labs  ?  05/24/21 ?1113  ?PROT 7.1   ?ALBUMIN 4.3  ?AST 32  ?ALT 25  ?ALKPHOS 49  ?BILITOT 0.5  ? ? ?Troponin level is 22. ? ?Studies/Results: ?DG Chest Portable 1 View ? ?Result Date: 05/24/2021 ?CLINICAL DATA:  Chest pain EXAM: PORTABLE CHEST 1 VIEW COMPA

## 2021-05-24 NOTE — Transfer of Care (Signed)
Immediate Anesthesia Transfer of Care Note ? ?Patient: ABDULAH IQBAL ? ?Procedure(s) Performed: ESOPHAGOGASTRODUODENOSCOPY (EGD) WITH PROPOFOL ?ESOPHAGEAL DILATION ?FOREIGN BODY REMOVAL ? ?Patient Location: PACU ? ?Anesthesia Type:General ? ?Level of Consciousness: drowsy ? ?Airway & Oxygen Therapy: Patient Spontanous Breathing ? ?Post-op Assessment: Report given to RN and Post -op Vital signs reviewed and stable ? ?Post vital signs: Reviewed and stable ? ?Last Vitals:  ?Vitals Value Taken Time  ?BP    ?Temp    ?Pulse 55 05/24/21 1652  ?Resp 18 05/24/21 1652  ?SpO2 100 % 05/24/21 1652  ?Vitals shown include unvalidated device data. ? ?Last Pain:  ?Vitals:  ? 05/24/21 1422  ?TempSrc:   ?PainSc: 0-No pain  ?   ? ?Patients Stated Pain Goal: 5 (05/24/21 1343) ? ?Complications: No notable events documented. ?

## 2021-05-24 NOTE — H&P (Addendum)
?History and Physical  ? ? ?Kent Davidson UMP:[id number] DOB: 07-Dec-1955 DOA: 05/24/2021 ? ?PCP: Celene Squibb, MD  ? ?Patient coming from: Home ? ?I have personally briefly reviewed patient's old medical records in [place] ? ?Chief Complaint: Difficulty swallowing ? ?HPI: Kent Davidson is a 66 y.o. male with medical history significant for atrial fibrillation, hypertension. ?Patient was brought to the ED as of inability to swallow since Sunday-3 days ago.  Patient reports that pain in his throat back and chest, and subsequent vomiting when he tried to swallow food or liquids.  He was unable to swallow his own secretions.  At the time of my evaluation patient is status post EGD, sleeping but easily arousable, answers questions, drifts back to sleep.  On arrival to the ED complained of pain in his upper chest, with stabbing pains in his right shoulder, that " takes his breath away".  He tells me the pain has now resolved.  And he feels better. ? ?ED Course: Bradycardic, heart rate 37-51.  Respirate rate 11-18.  Blood pressure systolic 400Q to 676P.  O2 sat greater than 98% on room air. ?Troponin 22. ?EKG showed sinus bradycardia. ?Glucagon 1 mg was given. ?GI was consulted, patient subsequently underwent EGD, food was found in the distal esophagus and this was removed. ? ?Review of Systems: As per HPI all other systems reviewed and negative. ? ?Past Medical History:  ?Diagnosis Date  ? Atrial fibrillation ([place])   ? Documented October 2020  ? Esophageal obstruction due to food impaction   ? October 2020  ? Gastroesophageal reflux   ? ? ?Past Surgical History:  ?Procedure Laterality Date  ? APPENDECTOMY    ? ESOPHAGEAL DILATION  11/19/2018  ? Procedure: ESOPHAGEAL DILATION;  Surgeon: Rogene Houston, MD;  Location: [facility];  Service: Endoscopy;;  ? ESOPHAGOGASTRODUODENOSCOPY N/A 11/19/2018  ? Procedure: ESOPHAGOGASTRODUODENOSCOPY (EGD);  Surgeon: Rogene Houston, MD;  Location: [facility];  Service: Endoscopy;  Laterality: N/A;  ? I & D EXTREMITY Right 10/03/2018  ? Procedure: IRRIGATION AND DEBRIDEMENT  RIGHT ULNA;  Surgeon: Carole Civil, MD;  Location: [facility];  Service: Orthopedics;  Laterality: Right;  ? ORIF ULNAR FRACTURE Right 10/03/2018  ? Procedure: OPEN REDUCTION INTERNAL FIXATION (ORIF) RIGHT ULNAR FRACTURE;  Surgeon: Carole Civil, MD;  Location: [facility];  Service: Orthopedics;  Laterality: Right;  ? TENDON REPAIR  11/13/2011  ? Procedure: TENDON REPAIR;  Surgeon: Tennis Must, MD;  Location: [facility];  Service: Orthopedics;  Laterality: Left;  incision and drainage with percutaneous pinning left index finger   ? ? ? reports that he has been smoking cigarettes. He has been smoking an average of .5 packs per day. His smokeless tobacco use includes snuff. He reports that he does not drink alcohol and does not use drugs. ? ?Allergies  ?Allergen Reactions  ? Bee Venom Anaphylaxis  ? ? ?Family History  ?Problem Relation Age of Onset  ? Diabetes Mother   ? Hypertension Mother   ? Heart attack Mother   ? ? ?Prior to Admission medications   ?Medication Sig Start Date End Date Taking? Authorizing Provider  ?aspirin EC 81 MG tablet Take 1 tablet (81 mg total) by mouth daily. 12/19/18  Yes Satira Sark, MD  ?atorvastatin (LIPITOR) 20 MG tablet Take 20 mg by mouth daily. 01/31/21  Yes [provider]  ?diltiazem (CARDIZEM) 30 MG tablet May take as needed  30 mg  for rapid heart rate over 100. May repeat once after 30 minutes. Take no more than 4 tablets in 24 hours 06/26/19  Yes Satira Sark, MD  ?flecainide (TAMBOCOR) 50 MG tablet Take 1 tablet (50 mg total) by mouth 2 (two) times daily. 04/26/21  Yes Evans Lance, MD  ?levothyroxine (SYNTHROID) 75 MCG tablet Take 75 mcg by mouth daily before breakfast.   Yes [provider]  ?metoprolol tartrate (LOPRESSOR) 25 MG tablet Take 12.5 mg by mouth daily. 01/31/21  Yes [provider]   ?pantoprazole (PROTONIX) 40 MG tablet Take 1 tablet (40 mg total) by mouth 2 (two) times daily. 09/29/19  Yes Strader, Fransisco Hertz, PA-C  ?sertraline (ZOLOFT) 50 MG tablet Take 50 mg by mouth daily. 01/31/21  Yes [provider]  ?Vitamin D, Ergocalciferol, (DRISDOL) 1.25 MG (50000 UNIT) CAPS capsule Take 50,000 Units by mouth once a week. 04/22/21  Yes [provider]  ? ? ?Physical Exam: ?Vitals:  ? 05/24/21 1824 05/24/21 1825 05/24/21 1826 05/24/21 1945  ?BP: (!) 155/83 (!) 143/83 (!) 147/84 (!) 149/82  ?Pulse:   (!) 38 (!) 40  ?Resp:   11 20  ?Temp:    98 ?F (36.7 ?C)  ?TempSrc:    Oral  ?SpO2:   98% 100%  ?Weight:      ?Height:      ? ? ?Constitutional: NAD, calm, comfortable ?Vitals:  ? 05/24/21 1824 05/24/21 1825 05/24/21 1826 05/24/21 1945  ?BP: (!) 155/83 (!) 143/83 (!) 147/84 (!) 149/82  ?Pulse:   (!) 38 (!) 40  ?Resp:   11 20  ?Temp:    98 ?F (36.7 ?C)  ?TempSrc:    Oral  ?SpO2:   98% 100%  ?Weight:      ?Height:      ? ?Eyes: PERRL, lids and conjunctivae normal ?ENMT: Mucous membranes are moist.  ?Neck: normal, supple, no masses, no thyromegaly ?Respiratory: clear to auscultation bilaterally, no wheezing, no crackles. Normal respiratory effort. No accessory muscle use.  ?Cardiovascular: Regular rate and rhythm, no murmurs / rubs / gallops. No extremity edema. 2+ pedal pulses.  ?Abdomen: no tenderness, no masses palpated. No hepatosplenomegaly. Bowel sounds positive.  ?Musculoskeletal: no clubbing / cyanosis. No joint deformity upper and lower extremities.  ?Skin: no rashes, lesions, ulcers. No induration ?Neurologic: No apparent cranial nerve abnormality moving extremities spontaneously ?Psychiatric: Normal judgment and insight. Alert and oriented x 3. Normal mood.  ? ?Labs on Admission: I have personally reviewed following labs and imaging studies ? ?CBC: ?Recent Labs  ?Lab 05/24/21 ?1113  ?WBC 6.0  ?NEUTROABS 4.2  ?HGB 14.8  ?HCT 45.9  ?MCV 95.6  ?PLT 202  ? ?Basic Metabolic  Panel: ?Recent Labs  ?Lab 05/24/21 ?1113  ?NA 139  ?K 3.9  ?CL 105  ?CO2 25  ?GLUCOSE 87  ?BUN 28*  ?CREATININE 1.05  ?CALCIUM 9.2  ? ?GFR: ?Estimated Creatinine Clearance: 61.7 mL/min (by C-G formula based on SCr of 1.05 mg/dL). ?Liver Function Tests: ?Recent Labs  ?Lab 05/24/21 ?1113  ?AST 32  ?ALT 25  ?ALKPHOS 49  ?BILITOT 0.5  ?PROT 7.1  ?ALBUMIN 4.3  ? ?Recent Labs  ?Lab 05/24/21 ?1113  ?LIPASE 41  ? ?Urine analysis: ?   ?Component Value Date/Time  ? COLORURINE STRAW (A) 04/19/2021 1102  ? APPEARANCEUR CLEAR 04/19/2021 1102  ? LABSPEC 1.011 04/19/2021 1102  ? PHURINE 7.0 04/19/2021 1102  ? GLUCOSEU NEGATIVE 04/19/2021 1102  ? HGBUR NEGATIVE 04/19/2021 1102  ? BILIRUBINUR NEGATIVE  04/19/2021 1102  ? [place] NEGATIVE 04/19/2021 1102  ? PROTEINUR NEGATIVE 04/19/2021 1102  ? NITRITE NEGATIVE 04/19/2021 1102  ? LEUKOCYTESUR NEGATIVE 04/19/2021 1102  ? ? ?Radiological Exams on Admission: ?DG Chest Portable 1 View ? ?Result Date: 05/24/2021 ?CLINICAL DATA:  Chest pain EXAM: PORTABLE CHEST 1 VIEW COMPARISON:  Radiograph 05/10/2019 FINDINGS: Unchanged cardiomediastinal silhouette. There is no focal airspace consolidation. There is no large pleural effusion. There is no pneumothorax. Unchanged punctate metallic density overlying the left axilla. Thoracic spondylosis. IMPRESSION: No evidence of acute cardiopulmonary disease. Electronically Signed   By: Maurine Simmering M.D.   On: 05/24/2021 11:10   ? ?EKG: Independently reviewed.  Sinus bradycardia, rate 39, QTc 443.  PR interval 122. ? ?Assessment/Plan ?Principal Problem: ?  Esophageal obstruction due to food impaction ?Active Problems: ?  Atrial fibrillation ([place]) ?  Essential hypertension ?  Sinus bradycardia ?  ? ?Assessment and Plan: ?* Esophageal obstruction due to food impaction ?Likely etiology of his chest pain and shoulder pain also. ? ?EGD by Dr. Laural Golden today findings- Normal hypopharynx. ?- Normal proximal esophagus and mid esophagus. ?- Abnormal esophageal  motility, suspicious for presbyesophagus. ?- Benign-appearing esophageal stenosis. Dilated. ?- Food was found in the esophagus. Removal was successful. ?- LA Grade A esophagitis with no bleeding. ?- Normal stomach. ?- N

## 2021-05-24 NOTE — Progress Notes (Signed)
Brief procedure note. ? ?Esophageal obstruction secondary to large food bolus. ?Food bolus removed with polypectomy snare rescue net and residual was pushed into stomach after distal esophageal stricture dilated to 18 mm with a balloon. ?Distal esophagitis secondary to food impaction. ? ?Procedure time 2 hours and 15 minutes. ?Patient tolerated procedure well. ?

## 2021-05-24 NOTE — Op Note (Signed)
[facility] ?Patient Name: Kent Davidson ?Procedure Date: 05/24/2021 2:08 PM ?MRN: [id number] ?Date of Birth: 1955/02/21 ?Attending MD: Hildred Laser , MD ?CSN: [id number] ?Age: 66 ?Admit Type: Outpatient ?Procedure:                Upper GI endoscopy ?Indications:              Foreign body in the esophagus ?Providers:                Hildred Laser, MD, Caprice Kluver, Nelma Rothman, Technician ?Referring MD:              ?Medicines:                General Anesthesia ?Complications:            No immediate complications. ?Estimated Blood Loss:     Estimated blood loss was minimal. ?Procedure:                Pre-Anesthesia Assessment: ?                          - Prior to the procedure, a History and Physical  ?                          was performed, and patient medications and  ?                          allergies were reviewed. The patient's tolerance of  ?                          previous anesthesia was also reviewed. The risks  ?                          and benefits of the procedure and the sedation  ?                          options and risks were discussed with the patient.  ?                          All questions were answered, and informed consent  ?                          was obtained. Prior Anticoagulants: The patient has  ?                          taken no previous anticoagulant or antiplatelet  ?                          agents except for aspirin. ASA Grade Assessment:  ?                          III - A patient with severe systemic disease. After  ?                          reviewing the risks and benefits, the patient was  ?  deemed in satisfactory condition to undergo the  ?                          procedure. ?                          After obtaining informed consent, the endoscope was  ?                          passed under direct vision. Throughout the  ?                          procedure, the patient's blood pressure, pulse, and  ?                          oxygen  saturations were monitored continuously. The  ?                          GIF-H190 ([id number]) scope was introduced through the  ?                          mouth, and advanced to the second part of duodenum.  ?                          The upper GI endoscopy was technically difficult  ?                          and complex due to presence of food and narrowing.  ?                          The patient tolerated the procedure well. ?Scope In: 2:29:07 PM ?Scope Out: 4:47:06 PM ?Total Procedure Duration: 2 hours 17 minutes 59 seconds  ?Findings: ?     The hypopharynx was normal. ?     The proximal esophagus and mid esophagus were normal. ?     Abnormal motility was noted in the distal esophagus. There is spasticity  ?     of the esophageal body. ?     One benign-appearing, intrinsic moderate stenosis was found 41 cm from  ?     the incisors. The stenosis was traversed. A TTS dilator was passed  ?     through the scope. Dilation with a 15-16.5-18 mm balloon dilator was  ?     performed to 15 mm, 16.5 mm and 18 mm. The dilation site was examined  ?     and showed mild mucosal disruption, mild improvement in luminal  ?     narrowing and no perforation. ?     Food was found in the distal esophagus. Removal was accomplished with a  ?     rat-toothed forceps, Roth net and snare. ?     LA Grade A (one or more mucosal breaks less than 5 mm, not extending  ?     between tops of 2 mucosal folds) esophagitis with no bleeding was found  ?     35 to 41 cm from the incisors. ?     The entire examined stomach was normal. ?     An examination  of the duodenum was not performed. ?Impression:               - Normal hypopharynx. ?                          - Normal proximal esophagus and mid esophagus. ?                          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 ?                          - Benign-appearing esophageal stenosis. Dilated. ?                          - Food was found in the  esophagus. Removal was  ?                          successful. ?                          - LA Grade A esophagitis with no bleeding. ?                          - Normal stomach. ?                          - Normal duodenal bulb and second portion of the  ?                          duodenum. ?Moderate Sedation: ?     Per Anesthesia Care ?Recommendation:           - Return patient to ER for ongoing care and  ?                          possible admission. ?                          - Clear liquid diet today. ?                          - Pantoprazole 40 mg IV q 12 hours. ?                          - No aspirin, ibuprofen, naproxen, or other  ?                          non-steroidal anti-inflammatory drugs for 1 day. ?                          - Repeat upper endoscopy in 2 weeks. ?Procedure Code(s):        --- Professional --- ?                          [id number],  Esophagogastroduodenoscopy, flexible,  ?                          transoral; with removal of foreign body(s) ?                          [id number], Esophagogastroduodenoscopy, flexible,  ?                          transoral; with transendoscopic balloon dilation of  ?                          esophagus (less than 30 mm diameter) ?Diagnosis Code(s):        --- Professional --- ?                          K22.4, Dyskinesia of esophagus ?                          K22.2, Esophageal obstruction ?                          T18.128A, Food in esophagus causing other injury,  ?                          initial encounter ?                          K20.90, Esophagitis, unspecified without bleeding ?                          T18.108A, Unspecified foreign body in esophagus  ?                          causing other injury, initial encounter ?CPT copyright 2019 [employer]. All rights reserved. ?The codes documented in this report are preliminary and upon coder review may  ?be revised to meet current compliance requirements. ?Hildred Laser, MD ?Hildred Laser,  MD ?05/24/2021 5:07:00 PM ?This report has been signed electronically. ?Number of Addenda: 0 ?

## 2021-05-24 NOTE — ED Provider Notes (Signed)
?[facility] ?Provider Note ? ? ?CSN: [id number] ?Arrival date & time: 05/24/21  5784 ? ?  ? ?History ? ?Chief Complaint  ?Patient presents with  ? Dysphagia  ? ? ?Kent Davidson is a 66 y.o. male. ? ?HPI ? ?  ? ?Kent Davidson is a 66 y.o. male with past medical history significant for GERD, atrial fibrillation, hypertension, symptomatic bradycardia and prior esophageal obstruction who presents to the Emergency Department complaining of difficulty swallowing x 3 days.  He describes having vomiting when he tries to swallow food or liquids.  Unable to swallow his own secretions.  He feels pain at the base of his throat and upper chest.  He last had upper endoscopy in 2020 by Dr. Laural Golden.  Takes Protonix twice daily. ? ?Upon ER arrival, patient reported having sharp stabbing type pains of his upper chest and pain at his right shoulder.  He states that shoulder pain feels like a ""cramp."  States the sharp pain of his chest "takes his breath away." ? ?Home Medications ?Prior to Admission medications   ?Medication Sig Start Date End Date Taking? Authorizing Provider  ?aspirin EC 81 MG tablet Take 1 tablet (81 mg total) by mouth daily. 12/19/18   Satira Sark, MD  ?atorvastatin (LIPITOR) 20 MG tablet Take 20 mg by mouth daily. 01/31/21   [provider]  ?Cholecalciferol 1.25 MG (50000 UT) capsule cholecalciferol (vitamin D3) 1,250 mcg (50,000 unit) capsule ? Take 1 capsule every week by oral route. ? Take 1 capsule weekly in the morning 30 minutes before food. ?Patient not taking: Reported on 04/19/2021    [provider]  ?diltiazem (CARDIZEM) 30 MG tablet May take as needed  30 mg for rapid heart rate over 100. May repeat once after 30 minutes. Take no more than 4 tablets in 24 hours ?Patient not taking: Reported on 04/19/2021 06/26/19   Satira Sark, MD  ?flecainide (TAMBOCOR) 50 MG tablet Take 1 tablet (50 mg total) by mouth 2 (two) times daily. 04/26/21    Evans Lance, MD  ?levothyroxine (SYNTHROID) 25 MCG tablet Take 25 mcg by mouth daily. 01/31/21   [provider]  ?metoprolol tartrate (LOPRESSOR) 25 MG tablet Take 12.5 mg by mouth daily. 01/31/21   [provider]  ?pantoprazole (PROTONIX) 40 MG tablet Take 1 tablet (40 mg total) by mouth 2 (two) times daily. 09/29/19   Strader, Fransisco Hertz, PA-C  ?sertraline (ZOLOFT) 50 MG tablet Take 50 mg by mouth daily. 01/31/21   [provider]  ?   ? ?Allergies    ?Bee venom   ? ?Review of Systems   ?Review of Systems  ?Constitutional:  Positive for appetite change. Negative for chills and fever.  ?Respiratory:  Negative for shortness of breath.   ?Cardiovascular:  Positive for chest pain.  ?Gastrointestinal:  Positive for vomiting. Negative for abdominal pain and nausea.  ?Musculoskeletal:  Positive for arthralgias (Right shoulder pain).  ?All other systems reviewed and are negative. ? ?Physical Exam ?Updated Vital Signs ?BP 120/81   Pulse (!) 42   Temp 97.6 ?F (36.4 ?C) (Oral)   Resp 16   Ht '5\' 11"'$  (1.803 m)   Wt 63 kg   SpO2 100%   BMI 19.37 kg/m?  ?Physical Exam ?Vitals and nursing note reviewed.  ?Constitutional:   ?   Appearance: Normal appearance.  ?HENT:  ?   Mouth/Throat:  ?   Mouth: Mucous membranes are moist.  ?  Pharynx: Oropharynx is clear. Uvula midline. No pharyngeal swelling, oropharyngeal exudate, posterior oropharyngeal erythema or uvula swelling.  ?   Comments: Oropharynx is without erythema or edema.  No foreign bodies.  Uvula midline nonedematous.  No oral lesions on exam. ?Cardiovascular:  ?   Rate and Rhythm: Regular rhythm. Bradycardia present.  ?   Pulses: Normal pulses.  ?Pulmonary:  ?   Effort: Pulmonary effort is normal. No respiratory distress.  ?   Breath sounds: Normal breath sounds.  ?Abdominal:  ?   General: There is no distension.  ?   Palpations: Abdomen is soft.  ?   Tenderness: There is no abdominal tenderness.  ?Musculoskeletal:  ?   Right lower leg:  No edema.  ?   Left lower leg: No edema.  ?Skin: ?   General: Skin is warm.  ?   Capillary Refill: Capillary refill takes less than 2 seconds.  ?   Findings: No rash.  ?Neurological:  ?   General: No focal deficit present.  ?   Mental Status: He is alert.  ?   Sensory: No sensory deficit.  ?   Motor: No weakness.  ? ? ?ED Results / Procedures / Treatments   ?Labs ?(all labs ordered are listed, but only abnormal results are displayed) ?Labs Reviewed  ?COMPREHENSIVE METABOLIC PANEL - Abnormal; Notable for the following components:  ?    Result Value  ? BUN 28 (*)   ? All other components within normal limits  ?TROPONIN I (HIGH SENSITIVITY) - Abnormal; Notable for the following components:  ? Troponin I (High Sensitivity) 22 (*)   ? All other components within normal limits  ?TROPONIN I (HIGH SENSITIVITY) - Abnormal; Notable for the following components:  ? Troponin I (High Sensitivity) 20 (*)   ? All other components within normal limits  ?CBC WITH DIFFERENTIAL/PLATELET  ?LIPASE, BLOOD  ? ? ?EKG ?EKG Interpretation ? ?Date/Time:  Wednesday May 24 2021 10:18:46 EDT ?Ventricular Rate:  38 ?PR Interval:  123 ?QRS Duration: 147 ?QT Interval:  564 ?QTC Calculation: 449 ?R Axis:   -40 ?Text Interpretation: Sinus bradycardia Nonspecific IVCD with LAD similar to multiple prior Confirmed by Noemi Chapel [phone]) on 05/24/2021 10:32:23 AM ? ?Radiology ?DG Chest Portable 1 View ? ?Result Date: 05/24/2021 ?CLINICAL DATA:  Chest pain EXAM: PORTABLE CHEST 1 VIEW COMPARISON:  Radiograph 05/10/2019 FINDINGS: Unchanged cardiomediastinal silhouette. There is no focal airspace consolidation. There is no large pleural effusion. There is no pneumothorax. Unchanged punctate metallic density overlying the left axilla. Thoracic spondylosis. IMPRESSION: No evidence of acute cardiopulmonary disease. Electronically Signed   By: Maurine Simmering M.D.   On: 05/24/2021 11:10   ? ?Procedures ?Procedures  ? ? ?Medications Ordered in ED ?Medications - No  data to display ? ?ED Course/ Medical Decision Making/ A&P ?  ?                        ?Medical Decision Making ?Patient here for evaluation of dysphagia.  Symptoms began Sunday.  States he is unable to handle his own secretions and complains of sore throat and foreign body sensation at the base of his throat.  Last upper endoscopy in 2020. ? ?Upon arrival here, patient began to complain of sharp stabbing mid chest pain with aching pain of his right shoulder.  He was noted to be bradycardic, has history of same.  He has been normotensive.  He admits to taking his Lopressor, flecainide and Cardizem today.  Given his history of heart disease including atrial fibrillation, well obtain labs including troponin.  If cardiac work-up is reassuring, I will contact GI as he will likely need endoscopy ? ?Amount and/or Complexity of Data Reviewed ?External Data Reviewed: notes. ?   Details: Patient had echocardiogram 02/09/2019 that showed EF of 55 to 60%. ?Labs: ordered. ?   Details: Labs today reviewed by me no evidence of leukocytosis or anemia, electrolytes show mildly elevated BUN at 28, serum creatinine reassuring at 1.05.  No other electrolyte abnormalities.  Lipase unremarkable.  Initial troponin slightly elevated at 22.  Will compare with delta troponin. ?Radiology: ordered. ?   Details: Chest x-ray without evidence of acute cardiopulmonary disease.  Agree with radiology interpretation. ?Discussion of management or test interpretation with external provider(s): Patient presented to the emergency department for evaluation of difficulty swallowing.  History of prior food impaction with last endoscopy in 2020.  Upon his arrival, he began having sharp stabbing mid chest pains with pain to his right shoulder.  His right shoulder pain has since subsided.  He is bradycardic on arrival with known history of bradycardia.  He is on flecainide and a beta-blocker.  He has been normotensive, does not appear septic. ? ?I attempted an  oral fluid challenge and patient vomited shortly after swallowing water.  He may need hospital admission for cardiac rule out, but will consult GI as he will likely also need endoscopy. ? ?Discussed findings

## 2021-05-24 NOTE — Anesthesia Procedure Notes (Signed)
Procedure Name: Intubation ?Date/Time: 05/24/2021 2:26 PM ?Performed by: Ollen Bowl, CRNA ?Pre-anesthesia Checklist: Patient identified, Patient being monitored, Timeout performed, Emergency Drugs available and Suction available ?Patient Re-evaluated:Patient Re-evaluated prior to induction ?Oxygen Delivery Method: Circle System Utilized ?Preoxygenation: Pre-oxygenation with 100% oxygen ?Induction Type: IV induction ?Ventilation: Mask ventilation without difficulty ?Laryngoscope Size: Mac and 3 ?Grade View: Grade II ?Tube type: Oral ?Tube size: 7.0 mm ?Number of attempts: 1 ?Airway Equipment and Method: stylet ?Placement Confirmation: ETT inserted through vocal cords under direct vision, positive ETCO2 and breath sounds checked- equal and bilateral ?Secured at: 21 cm ?Tube secured with: Tape ?Dental Injury: Teeth and Oropharynx as per pre-operative assessment  ? ? ? ? ?

## 2021-05-24 NOTE — ED Notes (Signed)
Endo staff here to take pt to endo.  ?

## 2021-05-24 NOTE — Anesthesia Postprocedure Evaluation (Signed)
Anesthesia Post Note ? ?Patient: DEZMOND DOWNIE ? ?Procedure(s) Performed: ESOPHAGOGASTRODUODENOSCOPY (EGD) WITH PROPOFOL ?ESOPHAGEAL DILATION ?FOREIGN BODY REMOVAL ? ?Patient location during evaluation: PACU ?Anesthesia Type: General ?Level of consciousness: awake and alert ?Pain management: pain level controlled ?Vital Signs Assessment: post-procedure vital signs reviewed and stable ?Respiratory status: spontaneous breathing, nonlabored ventilation, respiratory function stable and patient connected to nasal cannula oxygen ?Cardiovascular status: blood pressure returned to baseline and stable ?Postop Assessment: no apparent nausea or vomiting ?Anesthetic complications: no ? ? ?No notable events documented. ? ? ?Last Vitals:  ?Vitals:  ? 05/24/21 1343 05/24/21 1354  ?BP: (!) 143/64   ?Pulse: (!) 43 (!) 42  ?Resp: 18 12  ?Temp: 36.8 ?C   ?SpO2: 100% 98%  ?  ?Last Pain:  ?Vitals:  ? 05/24/21 1422  ?TempSrc:   ?PainSc: 0-No pain  ? ? ?  ?  ?  ?  ?  ?  ? ?Louann Sjogren ? ? ? ? ?

## 2021-05-24 NOTE — Assessment & Plan Note (Signed)
Likely etiology of his chest pain and shoulder pain also. ? ?EGD by Dr. Laural Golden today findings- Normal hypopharynx. ?- Normal proximal esophagus and mid esophagus. ?- Abnormal esophageal motility, suspicious for presbyesophagus. ?- Benign-appearing esophageal stenosis. Dilated. ?- Food was found in the esophagus. Removal was successful. ?- LA Grade A esophagitis with no bleeding. ?- Normal stomach. ?- Normal duodenal bulb and second portion of the duodenum ? ?-Recommendations- Return patient to ER for ongoing care and possible admission. ?- Clear liquid diet today. ?- Pantoprazole 40 mg IV q 12 hours. ?- No aspirin, ibuprofen, naproxen, or other non-steroidal anti-inflammatory drugs for 1 ?day. ?- Repeat upper endoscopy in 2 weeks. ? ?

## 2021-05-24 NOTE — ED Triage Notes (Signed)
Pt to the ED with complaints of inability to swallow since Sunday. Pt states he is unable to swallow his own secretions. ? ?The pt states he has been having pain in his throat, upper back and chest at times. ? ?

## 2021-05-24 NOTE — Assessment & Plan Note (Addendum)
Rate controlled, bradycardic heart rate 37-51.  History of same, prior EKGs back to 2020 mostly showing heart rates from 46-50, occasional rates 80s and low 100s with atria fib.  Follows with Dr. Johnsie Cancel and subsequently referred to Dr. 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 ?-Patient was started on flecainide 04/2020 by Dr. Lovena Le ?-Resume Eliquis ?-Patient is not supposed to be on AV blocking drugs, but per med rec it appears patient is taking metoprolol '25mg'$ .  ?

## 2021-05-24 NOTE — ED Notes (Signed)
Dr aware of bradycardia.  ?

## 2021-05-25 DIAGNOSIS — K222 Esophageal obstruction: Secondary | ICD-10-CM | POA: Diagnosis not present

## 2021-05-25 DIAGNOSIS — T18128A Food in esophagus causing other injury, initial encounter: Secondary | ICD-10-CM | POA: Diagnosis not present

## 2021-05-25 LAB — CBC
HCT: 42.7 % (ref 39.0–52.0)
Hemoglobin: 14 g/dL (ref 13.0–17.0)
MCH: 31.3 pg (ref 26.0–34.0)
MCHC: 32.8 g/dL (ref 30.0–36.0)
MCV: 95.3 fL (ref 80.0–100.0)
Platelets: 211 10*3/uL (ref 150–400)
RBC: 4.48 MIL/uL (ref 4.22–5.81)
RDW: 13.5 % (ref 11.5–15.5)
WBC: 6.7 10*3/uL (ref 4.0–10.5)
nRBC: 0 % (ref 0.0–0.2)

## 2021-05-25 LAB — BASIC METABOLIC PANEL
Anion gap: 5 (ref 5–15)
BUN: 20 mg/dL (ref 8–23)
CO2: 25 mmol/L (ref 22–32)
Calcium: 8.2 mg/dL — ABNORMAL LOW (ref 8.9–10.3)
Chloride: 110 mmol/L (ref 98–111)
Creatinine, Ser: 0.97 mg/dL (ref 0.61–1.24)
GFR, Estimated: >60 mL/min (ref >60)
Glucose, Bld: 74 mg/dL (ref 70–99)
Potassium: 3.4 mmol/L — ABNORMAL LOW (ref 3.5–5.1)
Sodium: 140 mmol/L (ref 135–145)

## 2021-05-25 LAB — MRSA NEXT GEN BY PCR, NASAL: MRSA by PCR Next Gen: NOT DETECTED

## 2021-05-25 LAB — HIV ANTIBODY (ROUTINE TESTING W REFLEX): HIV Screen 4th Generation wRfx: NONREACTIVE

## 2021-05-25 MED ORDER — ASPIRIN EC 81 MG PO TBEC: 81.0000 mg | DELAYED_RELEASE_TABLET | Freq: Every day | ORAL | 3 refills | Status: AC

## 2021-05-25 MED ORDER — NICOTINE 21 MG/24HR TD PT24: 21.0000 mg | MEDICATED_PATCH | Freq: Every day | TRANSDERMAL | 0 refills | Status: DC

## 2021-05-25 MED ORDER — ONDANSETRON HCL 4 MG PO TABS: 4.0000 mg | ORAL_TABLET | Freq: Four times a day (QID) | ORAL | 0 refills | Status: DC | PRN

## 2021-05-25 MED ORDER — ATORVASTATIN CALCIUM 20 MG PO TABS: 20.0000 mg | ORAL_TABLET | Freq: Every day | ORAL | 3 refills | Status: AC

## 2021-05-25 MED ORDER — POTASSIUM CHLORIDE CRYS ER 20 MEQ PO TBCR: 40.0000 meq | EXTENDED_RELEASE_TABLET | Freq: Once | ORAL | Status: DC

## 2021-05-25 MED ORDER — CHLORHEXIDINE GLUCONATE CLOTH 2 % EX PADS
6.0000 | MEDICATED_PAD | Freq: Every day | CUTANEOUS | Status: DC
  Administered 2021-05-25: 6 via TOPICAL

## 2021-05-25 MED ORDER — PANTOPRAZOLE SODIUM 40 MG PO TBEC: 40.0000 mg | DELAYED_RELEASE_TABLET | Freq: Two times a day (BID) | ORAL | 3 refills | Status: AC

## 2021-05-25 NOTE — Discharge Summary (Signed)
?                                                                                ? ? ?Kent Davidson, is a 66 y.o. male  DOB 27-Mar-1955  MRN [id number]. ? ?Admission date:  05/24/2021  Admitting Physician  Ejiroghene Arlyce Dice, MD ? ?Discharge Date:  05/25/2021  ? ?Primary MD  Celene Squibb, MD ? ?Recommendations for primary care physician for things to follow:  ? ?1)Please Avoid Meat, soft Diet advised  ?2)Follow up with Dr. Laural Golden-- address [address], [address], [place] [address], ?-Phone Number [phone] to set up Barium Esophagram and Repeat EGD/Endoscopy procedure/test ?3)Follow up with your Cardiologist to adjust your Heart Medications- ? ?Admission Diagnosis  Food impaction of esophagus [T18.128A] ?Dysphagia, unspecified type [R13.10] ? ? ?Discharge Diagnosis  Food impaction of esophagus [T18.128A] ?Dysphagia, unspecified type [R13.10]   ? ?Principal Problem: ?  Esophageal obstruction due to food impaction ?Active Problems: ?  Atrial fibrillation ([place]) ?  Essential hypertension ?  Sinus bradycardia ?    ? ?Past Medical History:  ?Diagnosis Date  ? Atrial fibrillation ([place])   ? Documented October 2020  ? Esophageal obstruction due to food impaction   ? October 2020  ? Gastroesophageal reflux   ? ? ?Past Surgical History:  ?Procedure Laterality Date  ? APPENDECTOMY    ? ESOPHAGEAL DILATION  11/19/2018  ? Procedure: ESOPHAGEAL DILATION;  Surgeon: Rogene Houston, MD;  [facility];  Service: Endoscopy;;  ? ESOPHAGOGASTRODUODENOSCOPY N/A 11/19/2018  ? Procedure: ESOPHAGOGASTRODUODENOSCOPY (EGD);  Surgeon: Rogene Houston, MD;  [facility];  Service: Endoscopy;  Laterality: N/A;  ? I & D EXTREMITY Right 10/03/2018  ? Procedure: IRRIGATION AND DEBRIDEMENT  RIGHT ULNA;  Surgeon: Carole Civil, MD;  Location: [facility];  Service: Orthopedics;  Laterality: Right;  ? ORIF ULNAR FRACTURE Right 10/03/2018  ? Procedure: OPEN REDUCTION INTERNAL FIXATION (ORIF) RIGHT ULNAR  FRACTURE;  Surgeon: Carole Civil, MD;  Location: [facility];  Service: Orthopedics;  Laterality: Right;  ? TENDON REPAIR  11/13/2011  ? Procedure: TENDON REPAIR;  Surgeon: Tennis Must, MD;  Location: [facility];  Service: Orthopedics;  Laterality: Left;  incision and drainage with percutaneous pinning left index finger   ? ? ? HPI  from the history and physical done on the day of admission:  ? ?  ?Chief Complaint: Difficulty swallowing ?  ?HPI: Kent Davidson is a 66 y.o. male with medical history significant for atrial fibrillation, hypertension. ?Patient was brought to the ED as of inability to swallow since Sunday-3 days ago.  Patient reports that pain in his throat back and chest, and subsequent vomiting when he tried to swallow food or liquids.  He was unable to swallow his own secretions.  At the time of my evaluation patient is status post EGD, sleeping but easily arousable, answers questions, drifts back to sleep.  On arrival to the ED complained of pain in his upper chest, with stabbing pains in his right shoulder, that " takes his breath away".  He tells me the pain has now resolved.  And he feels  better. ?  ?ED Course: Bradycardic, heart rate 37-51.  Respirate rate 11-18.  Blood pressure systolic 224S to 975P.  O2 sat greater than 98% on room air. ?Troponin 22. ?EKG showed sinus bradycardia. ?Glucagon 1 mg was given. ?GI was consulted, patient subsequently underwent EGD, food was found in the distal esophagus and this was removed. ?  ?Review of Systems: As per HPI all other systems reviewed and negative. ? ? ? Hospital Course:  ? ?Assessment and Plan: ?1) Esophageal obstruction due to food impaction ?Likely etiology of his chest pain and shoulder pain  ?-Symptoms resolved after EGD on 05/24/21 ?-EGD by Dr. Laural Golden findings- Normal hypopharynx. ?- Normal proximal esophagus and mid esophagus. ?- Abnormal esophageal motility, suspicious for presbyesophagus. ?- Benign-appearing  esophageal stenosis. Dilated. ?- Food was found in the esophagus. Removal was successful. ?- LA Grade A esophagitis with no bleeding. ?- Normal stomach. ?- Normal duodenal bulb and second portion of the duodenum ?--Recommendations-  ?- c/n Protonix ?- No aspirin, ibuprofen, naproxen, or other non-steroidal anti-inflammatory drugs  ?-Avoid Meats, soft diet advised ?-follow up with Dr. Otilio Jefferson set up Barium Esophagram and Repeat EGD/Endoscopy procedure/test ?-Patient has a history of dysphagia.  He previously underwent esophagogastroduodenoscopy with foreign body removal and esophageal dilation in October 2020 ? ? ?2)Essential hypertension ?Stable. ? ?3)Atrial fibrillation -- ?Recurrent episodes of Bradycardia ?--History of same, prior EKGs back to 2020 mostly showing heart rates from 46-50, occasional rates 80s and low 100s with atria fib.  Follows with Dr. Johnsie Cancel and subsequently referred to Dr. 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 ?-Patient was started on flecainide 04/2020 by Dr. Lovena Le ?-C/n Flecainide and Aspiration ?-Patient is not supposed to be on AV blocking drugs, but per med rec it appears patient is taking metoprolol '25mg'$ .  ?-Stop Metoprolol ?-Ok to use Cardizem only prn tachycardia ? ?4)Hypokalemia-- replaced,  ? ?Discharge Condition: stable ? ?Follow UP---with Dr Laural Golden as above ? Follow-up Information   ? ? Rogene Houston, MD. Schedule an appointment as soon as possible for a visit in 2 week(s).   ?Specialty: Gastroenterology ?Why: to set up Barium Esophagram and Repeat EGD/Endoscopy procedure/test ?Contact information: ?[address] [place], [address] ?[place] [address] ?[phone] ? ? ?  ?  ? ?  ?  ? ?  ?  ? ?Consults obtained - Gi  ? ?Diet and Activity recommendation:  As advised ? ?Discharge Instructions   ? ? ?Discharge Instructions   ? ? Call MD for:  persistant dizziness or light-headedness   Complete by: As directed ?  ? Call MD for:   persistant nausea and vomiting   Complete by: As directed ?  ? Call MD for:  temperature >100.4   Complete by: As directed ?  ? Diet - low sodium heart healthy   Complete by: As directed ?  ? Discharge instructions   Complete by: As directed ?  ? 1)Please Avoid Meat, soft Diet advised  ?2)Follow up with Dr. Laural Golden-- address [address], [place] [address], ?-Phone Number [phone] to set up Barium Esophagram and Repeat EGD/Endoscopy procedure/test ?3)Follow up with your Cardiologist to adjust your Heart Medications-  ? Increase activity slowly   Complete by: As directed ?  ? ?  ? ? Discharge Medications  ? ?  ?Allergies as of 05/25/2021   ? ?   Reactions  ? Bee Venom Anaphylaxis  ? ?  ? ?  ?Medication List  ?  ? ?  STOP taking these medications   ? ?metoprolol tartrate 25 MG tablet ?Commonly known as: LOPRESSOR ?  ? ?  ? ?TAKE these medications   ? ?aspirin EC 81 MG tablet ?Take 1 tablet (81 mg total) by mouth daily with breakfast. ?What changed: when to take this ?  ?atorvastatin 20 MG tablet ?Commonly known as: LIPITOR ?Take 1 tablet (20 mg total) by mouth daily. ?  ?diltiazem 30 MG tablet ?Commonly known as: Cardizem ?May take as needed  30 mg for rapid heart rate over 100. May repeat once after 30 minutes. Take no more than 4 tablets in 24 hours ?  ?flecainide 50 MG tablet ?Commonly known as: TAMBOCOR ?Take 1 tablet (50 mg total) by mouth 2 (two) times daily. ?  ?levothyroxine 75 MCG tablet ?Commonly known as: SYNTHROID ?Take 75 mcg by mouth daily before breakfast. ?  ?nicotine 21 mg/24hr patch ?Commonly known as: NICODERM CQ - dosed in mg/24 hours ?Place 1 patch (21 mg total) onto the skin daily. ?Start taking on: May 26, 2021 ?  ?ondansetron 4 MG tablet ?Commonly known as: ZOFRAN ?Take 1 tablet (4 mg total) by mouth every 6 (six) hours as needed for nausea. ?  ?pantoprazole 40 MG tablet ?Commonly known as: PROTONIX ?Take 1 tablet (40 mg total) by mouth 2 (two) times daily. ?  ?sertraline  50 MG tablet ?Commonly known as: ZOLOFT ?Take 50 mg by mouth daily. ?  ?Vitamin D (Ergocalciferol) 1.25 MG (50000 UNIT) Caps capsule ?Commonly known as: DRISDOL ?Take 50,000 Units by mouth once a week. ?  ? ?  ? ?M

## 2021-05-25 NOTE — Discharge Instructions (Signed)
1)Please Avoid Meat, soft Diet advised  ?2)Follow up with Dr. Laural Golden-- address [address], [address], [place] [address], ?-Phone Number [phone] to set up Barium Esophagram and Repeat EGD/Endoscopy procedure/test ?3)Follow up with your Cardiologist to adjust your Heart Medications- ?

## 2021-05-25 NOTE — TOC Progression Note (Signed)
?  Transition of Care (TOC) Screening Note ? ? ?Patient Details  ?Name: Kent Davidson ?Date of Birth: [birth year] ? ? ?Transition of Care (TOC) CM/SW Contact:    ?Shade Flood, LCSW ?Phone Number: ?05/25/2021, 10:49 AM ? ? ? ?Transition of Care Department [facility]) has reviewed patient and no TOC needs have been identified at this time. We will continue to monitor patient advancement through interdisciplinary progression rounds. If new patient transition needs arise, please place a TOC consult. ? ? ?

## 2021-05-25 NOTE — Progress Notes (Signed)
Diet changed to full liquids. ?

## 2021-05-25 NOTE — Addendum Note (Signed)
Addendum  created 05/25/21 1434 by Orlie Dakin, CRNA  ? Intraprocedure Staff edited  ?  ?

## 2021-05-30 ENCOUNTER — Encounter (HOSPITAL_COMMUNITY): Payer: Self-pay | Admitting: Internal Medicine

## 2021-05-30 DIAGNOSIS — R1310 Dysphagia, unspecified: Secondary | ICD-10-CM | POA: Diagnosis not present

## 2021-05-30 DIAGNOSIS — I482 Chronic atrial fibrillation, unspecified: Secondary | ICD-10-CM | POA: Diagnosis not present

## 2021-06-22 ENCOUNTER — Encounter (INDEPENDENT_AMBULATORY_CARE_PROVIDER_SITE_OTHER): Payer: Self-pay | Admitting: Gastroenterology

## 2021-06-22 ENCOUNTER — Ambulatory Visit (INDEPENDENT_AMBULATORY_CARE_PROVIDER_SITE_OTHER): Payer: Medicare Other | Admitting: Gastroenterology

## 2021-06-22 ENCOUNTER — Other Ambulatory Visit (INDEPENDENT_AMBULATORY_CARE_PROVIDER_SITE_OTHER): Payer: Self-pay

## 2021-06-22 ENCOUNTER — Encounter (INDEPENDENT_AMBULATORY_CARE_PROVIDER_SITE_OTHER): Payer: Self-pay

## 2021-06-22 ENCOUNTER — Telehealth (INDEPENDENT_AMBULATORY_CARE_PROVIDER_SITE_OTHER): Payer: Self-pay

## 2021-06-22 VITALS — BP 129/62 | HR 44 | Temp 97.3°F | Ht 71.0 in | Wt 137.5 lb

## 2021-06-22 DIAGNOSIS — K2100 Gastro-esophageal reflux disease with esophagitis, without bleeding: Secondary | ICD-10-CM

## 2021-06-22 DIAGNOSIS — K222 Esophageal obstruction: Secondary | ICD-10-CM

## 2021-06-22 DIAGNOSIS — K209 Esophagitis, unspecified without bleeding: Secondary | ICD-10-CM

## 2021-06-22 DIAGNOSIS — Z1211 Encounter for screening for malignant neoplasm of colon: Secondary | ICD-10-CM

## 2021-06-22 MED ORDER — PEG 3350-KCL-NA BICARB-NACL 420 G PO SOLR: 4000.0000 mL | ORAL | 0 refills | Status: DC

## 2021-06-22 NOTE — Patient Instructions (Signed)
I'm glad you are doing well! We will get you set up for the repeat EGD that Dr. Laural Golden recommended as well as a screening colonoscopy as it has been many years since you had one. Continue you pantoprazole/protonix '40mg'$  twice a day for now and make sure you are chewing thoroughly and avoiding thicker foods until the repeat EGD is done. If you are interested in hemorrhoid banding and colonoscopy indicates you are a candidate, we will be in touch with you in the next couple of months to discuss this further  Follow up 3 months

## 2021-06-22 NOTE — Progress Notes (Signed)
Referring Provider: Celene Squibb, MD Primary Care Physician:  Celene Squibb, MD Primary GI Physician:   Chief Complaint  Patient presents with   Follow-up    Patient her today for a follow up from [place]. He reports to have had his throat stretched. He says the dysphagia is better since the procedure.     HPI:   Kent Davidson is a 66 y.o. male with past medical history of A fib, esophageal obstruction d/t food impaction, GERD.   Patient presenting today for follow up of EGD in April after presented to [facility] ED with inability to swallow liquids or solids, previous EGD with foreign body removal in Oct 2020 with query of dysplastic segment, though patient was lost to follow up afterwards. Reported prior to this occurrence, he was having sporadic dysphagia with some episodes of regurgitation that relieved his symptoms. Reportedly lost 20-30 pounds over the past 2-3 years and having heartburn 1-2x/week. Is maintained on protonix '40mg'$  BID  Today, states that he is doing better since EGD. Reports that swallowing is much better. Reports that he does eat very fast and is trying to slow down. States that he has been avoiding meats and thicker foods as advised on his discharge instructions. will have some occasional gagging with a pill if he does not drink enough water, otherwise no issues with swallowing. He has had no episodes of regurgitation since his EGD. Denies any issues with heartburn. Is taking his protonix BID. Denies abdominal pain, nausea or vomiting. Weight has improved, he is up 11 pounds since late April. He does report that he works outside 10-12 hours per day but feels that since he is swallowing better, weight has picked up. States he is working on getting new teeth, considering implants so that he can chew better.   He denies constipation or diarrhea, feels that bowels move regularly without much issue. Denies rectal bleeding or melena, though does suspect he has some hemorrhoids as he  has some occasional discomfort in rectal area.  No red flag symptoms. Patient denies' \\nausea'$ , vomiting, odyonophagia, early satiety or weight loss.   Family hx: no CRC that he is aware of  Social: no etoh, uses dip and 1 Pack of cigarettes every few days.  Last Colonoscopy: many years ago, probably in the 1990s. Last Endoscopy:05/24/21- Normal hypopharynx. - Normal proximal esophagus and mid esophagus. - Abnormal esophageal motility, suspicious for presbyesophagus. - Benign-appearing esophageal stenosis. Dilated. - Food was found in the esophagus. Removal was successful. - LA Grade A esophagitis with no bleeding. - Normal stomach.   Past Medical History:  Diagnosis Date   Atrial fibrillation [facility])    Documented October 2020   Esophageal obstruction due to food impaction    October 2020   Gastroesophageal reflux     Past Surgical History:  Procedure Laterality Date   APPENDECTOMY     ESOPHAGEAL DILATION  11/19/2018   Procedure: ESOPHAGEAL DILATION;  Surgeon: Rogene Houston, MD;  [facility];  Service: Endoscopy;;   ESOPHAGEAL DILATION  05/24/2021   Procedure: ESOPHAGEAL DILATION;  Surgeon: Rogene Houston, MD;  [facility];  Service: Endoscopy;;   ESOPHAGOGASTRODUODENOSCOPY N/A 11/19/2018   Procedure: ESOPHAGOGASTRODUODENOSCOPY (EGD);  Surgeon: Rogene Houston, MD;  [facility];  Service: Endoscopy;  Laterality: N/A;   ESOPHAGOGASTRODUODENOSCOPY (EGD) WITH PROPOFOL N/A 05/24/2021   Procedure: ESOPHAGOGASTRODUODENOSCOPY (EGD) WITH PROPOFOL;  Surgeon: Rogene Houston, MD;  [facility];  Service: Endoscopy;  Laterality: N/A;   FOREIGN BODY REMOVAL N/A 05/24/2021   Procedure: FOREIGN BODY REMOVAL;  Surgeon: Rogene Houston, MD;  [facility];  Service: Endoscopy;  Laterality: N/A;   I & D EXTREMITY Right 10/03/2018   Procedure: IRRIGATION AND DEBRIDEMENT  RIGHT ULNA;  Surgeon: Carole Civil, MD;  [facility];   Service: Orthopedics;  Laterality: Right;   ORIF ULNAR FRACTURE Right 10/03/2018   Procedure: OPEN REDUCTION INTERNAL FIXATION (ORIF) RIGHT ULNAR FRACTURE;  Surgeon: Carole Civil, MD;  Location[facility];  Service: Orthopedics;  Laterality: Right;   TENDON REPAIR  11/13/2011   Procedure: TENDON REPAIR;  Surgeon: Tennis Must, MD;  Location: [facility];  Service: Orthopedics;  Laterality: Left;  incision and drainage with percutaneous pinning left index finger     Current Outpatient Medications  Medication Sig Dispense Refill   aspirin EC 81 MG tablet Take 1 tablet (81 mg total) by mouth daily with breakfast. 90 tablet 3   atorvastatin (LIPITOR) 20 MG tablet Take 1 tablet (20 mg total) by mouth daily. 90 tablet 3   diltiazem (CARDIZEM) 30 MG tablet May take as needed  30 mg for rapid heart rate over 100. May repeat once after 30 minutes. Take no more than 4 tablets in 24 hours 60 tablet 3   flecainide (TAMBOCOR) 50 MG tablet Take 1 tablet (50 mg total) by mouth 2 (two) times daily. 180 tablet 3   levothyroxine (SYNTHROID) 75 MCG tablet Take 75 mcg by mouth daily before breakfast.     pantoprazole (PROTONIX) 40 MG tablet Take 1 tablet (40 mg total) by mouth 2 (two) times daily. 180 tablet 3   sertraline (ZOLOFT) 50 MG tablet Take 50 mg by mouth daily.     Vitamin D, Ergocalciferol, (DRISDOL) 1.25 MG (50000 UNIT) CAPS capsule Take 50,000 Units by mouth once a week.     nicotine (NICODERM CQ - DOSED IN MG/24 HOURS) 21 mg/24hr patch Place 1 patch (21 mg total) onto the skin daily. (Patient not taking: Reported on 06/22/2021) 28 patch 0   No current facility-administered medications for this visit.    Allergies as of 06/22/2021 - Review Complete 06/22/2021  Allergen Reaction Noted   Bee venom Anaphylaxis 11/13/2011    Family History  Problem Relation Age of Onset   Diabetes Mother    Hypertension Mother    Heart attack Mother     Social History   Socioeconomic History    Marital status: Divorced    Spouse name: Not on file   Number of children: Not on file   Years of education: Not on file   Highest education level: Not on file  Occupational History   Not on file  Tobacco Use   Smoking status: Every Day    Packs/day: 0.50    Types: Cigarettes   Smokeless tobacco: Current    Types: Snuff  Vaping Use   Vaping Use: Never used  Substance and Sexual Activity   Alcohol use: No   Drug use: No   Sexual activity: Not on file  Other Topics Concern   Not on file  Social History Narrative   Not on file   Social Determinants of Health   Financial Resource Strain: Not on file  Food Insecurity: Not on file  Transportation Needs: Not on file  Physical Activity: Not on file  Stress: Not on file  Social Connections: Not on file   Review of systems General: negative for  malaise, night sweats, fever, chills, weight loss Neck: Negative for lumps, goiter, pain and significant neck swelling Resp: Negative for cough, wheezing, dyspnea at rest CV: Negative for chest pain, leg swelling, palpitations, orthopnea GI: denies melena, hematochezia, nausea, vomiting, diarrhea, constipation, odyonophagia, early satiety or unintentional weight loss. +very occasional pill dysphagia MSK: Negative for joint pain or swelling, back pain, and muscle pain. Derm: Negative for itching or rash Psych: Denies depression, anxiety, memory loss, confusion. No homicidal or suicidal ideation.  Heme: Negative for prolonged bleeding, bruising easily, and swollen nodes. Endocrine: Negative for cold or heat intolerance, polyuria, polydipsia and goiter. Neuro: negative for tremor, gait imbalance, syncope and seizures. The remainder of the review of systems is noncontributory.  Physical Exam: BP 129/62 (BP Location: Left Arm, Patient Position: Sitting, Cuff Size: Small)   Pulse (!) 44   Temp (!) 97.3 F (36.3 C) (Oral)   Ht '5\' 11"'$  (1.803 m)   Wt 137 lb 8 oz (62.4 kg)   BMI 19.18  kg/m  General:   Alert and oriented. No distress noted. Pleasant and cooperative.  Head:  Normocephalic and atraumatic. Eyes:  Conjuctiva clear without scleral icterus. Mouth:  Oral mucosa pink and moist. Good dentition. No lesions. Heart: Normal rate and rhythm, s1 and s2 heart sounds present.  Lungs: Clear lung sounds in all lobes. Respirations equal and unlabored. Abdomen:  +BS, soft, non-tender and non-distended. No rebound or guarding. No HSM or masses noted. Derm: No palmar erythema or jaundice Msk:  Symmetrical without gross deformities. Normal posture. Extremities:  Without edema. Neurologic:  Alert and  oriented x4 Psych:  Alert and cooperative. Normal mood and affect.  Invalid input(s): 6 MONTHS   ASSESSMENT: RAYEN DAFOE is a 66 y.o. male presenting today for a follow up of dysphagia after food impaction requiring emergent EGD.  Dysphagia much improved since EGD in April, is still avoiding thicker foods as instructed in his discharge papers. Denies heartburn or acid regurgitation, is compliant with PPI BID. Weight has improved since then, he is up 11 pounds. Recommendations to have repeat EGD 2 weeks after initial one performed, however, this has not yet been completed. We will get him set up for repeat EGD for further evaluation of UGI tract in absence of food bolus.  Last colonoscopy was many years ago, patient is at average risk, I recommended updating CRC screening via colonoscopy, to which patient is amenable. Patient also inquired about hemorrhoid banding as he had seen poster in the exam room, I discussed hemorrhoid banding procedure with the patient and advised that we can have him on recall list for possible hemorrhoid banding if he is deemed an appropriate candidate for this after updated colonoscopy.   Indications, risks and benefits of procedure discussed in detail with patient. Patient verbalized understanding and is in agreement to proceed with EGD and  Colonoscopy at this time.    PLAN:  Repeat EGD and colonoscopy-ENDO 3 2. Continue PPI BID 3. Chew thoroughly, avoid thicker/dryer foods until after repeat EGD 4. Possible hemorrhoid banding, will place on recall list for this  All questions were answered, patient verbalized understanding and is in agreement with plan as outlined above.    Follow Up: 3 months  Manroop Jakubowicz L. Alver Sorrow, MSN, APRN, AGNP-C Adult-Gerontology Nurse Practitioner [facility]

## 2021-06-22 NOTE — Telephone Encounter (Signed)
Truman Aceituno Ann Geanie Pacifico, CMA  ?

## 2021-06-23 ENCOUNTER — Encounter (INDEPENDENT_AMBULATORY_CARE_PROVIDER_SITE_OTHER): Payer: Self-pay

## 2021-06-28 ENCOUNTER — Ambulatory Visit (INDEPENDENT_AMBULATORY_CARE_PROVIDER_SITE_OTHER): Payer: Medicare Other | Admitting: Internal Medicine

## 2021-06-28 ENCOUNTER — Encounter: Payer: Self-pay | Admitting: Internal Medicine

## 2021-06-28 VITALS — BP 110/68 | HR 103 | Ht 71.0 in | Wt 140.0 lb

## 2021-06-28 DIAGNOSIS — I4891 Unspecified atrial fibrillation: Secondary | ICD-10-CM | POA: Diagnosis not present

## 2021-06-28 MED ORDER — METOPROLOL SUCCINATE ER 25 MG PO TB24: 25.0000 mg | ORAL_TABLET | Freq: Every day | ORAL | 3 refills | Status: AC

## 2021-06-28 NOTE — Progress Notes (Signed)
HPI Mr. Kent Davidson returns today for followup. He is a pleasant 66 yo man with COPD and tobacco abuse who has persistent atrial fib. He wore a cardiac monitor which demonstrated about 40% atrial fib. He has RVR with rates of over 200/min as well as nocturnal brady. He feels well. He does not have much in the way of palpitations. He has not had syncope. No chest pain. After his heart monitor we had him start low dose flecainide 50 mg twice daily. He returns for followup.  Allergies  Allergen Reactions   Bee Venom Anaphylaxis     Current Outpatient Medications  Medication Sig Dispense Refill   aspirin EC 81 MG tablet Take 1 tablet (81 mg total) by mouth daily with breakfast. 90 tablet 3   atorvastatin (LIPITOR) 20 MG tablet Take 1 tablet (20 mg total) by mouth daily. 90 tablet 3   diltiazem (CARDIZEM) 30 MG tablet May take as needed  30 mg for rapid heart rate over 100. May repeat once after 30 minutes. Take no more than 4 tablets in 24 hours 60 tablet 3   flecainide (TAMBOCOR) 50 MG tablet Take 1 tablet (50 mg total) by mouth 2 (two) times daily. 180 tablet 3   levothyroxine (SYNTHROID) 75 MCG tablet Take 75 mcg by mouth daily before breakfast.     pantoprazole (PROTONIX) 40 MG tablet Take 1 tablet (40 mg total) by mouth 2 (two) times daily. 180 tablet 3   polyethylene glycol-electrolytes (TRILYTE) 420 g solution Take 4,000 mLs by mouth as directed. 4000 mL 0   sertraline (ZOLOFT) 50 MG tablet Take 50 mg by mouth daily.     Vitamin D, Ergocalciferol, (DRISDOL) 1.25 MG (50000 UNIT) CAPS capsule Take 50,000 Units by mouth once a week.     No current facility-administered medications for this visit.     Past Medical History:  Diagnosis Date   Atrial fibrillation [facility])    Documented October 2020   Esophageal obstruction due to food impaction    October 2020   Gastroesophageal reflux     ROS:   All systems reviewed and negative except as noted in the HPI.   Past Surgical  History:  Procedure Laterality Date   APPENDECTOMY     ESOPHAGEAL DILATION  11/19/2018   Procedure: ESOPHAGEAL DILATION;  Surgeon: Rogene Houston, MD;  [facility];  Service: Endoscopy;;   ESOPHAGEAL DILATION  05/24/2021   Procedure: ESOPHAGEAL DILATION;  Surgeon: Rogene Houston, MD;  [facility];  Service: Endoscopy;;   ESOPHAGOGASTRODUODENOSCOPY N/A 11/19/2018   Procedure: ESOPHAGOGASTRODUODENOSCOPY (EGD);  Surgeon: Rogene Houston, MD;  [facility];  Service: Endoscopy;  Laterality: N/A;   ESOPHAGOGASTRODUODENOSCOPY (EGD) WITH PROPOFOL N/A 05/24/2021   Procedure: ESOPHAGOGASTRODUODENOSCOPY (EGD) WITH PROPOFOL;  Surgeon: Rogene Houston, MD;  [facility];  Service: Endoscopy;  Laterality: N/A;   FOREIGN BODY REMOVAL N/A 05/24/2021   Procedure: FOREIGN BODY REMOVAL;  Surgeon: Rogene Houston, MD;  [facility];  Service: Endoscopy;  Laterality: N/A;   I & D EXTREMITY Right 10/03/2018   Procedure: IRRIGATION AND DEBRIDEMENT  RIGHT ULNA;  Surgeon: Carole Civil, MD;  Location[facility];  Service: Orthopedics;  Laterality: Right;   ORIF ULNAR FRACTURE Right 10/03/2018   Procedure: OPEN REDUCTION INTERNAL FIXATION (ORIF) RIGHT ULNAR FRACTURE;  Surgeon: Carole Civil, MD;  Location[facility];  Service: Orthopedics;  Laterality: Right;   TENDON REPAIR  11/13/2011  Procedure: TENDON REPAIR;  Surgeon: Tennis Must, MD;  Location: [place];  Service: Orthopedics;  Laterality: Left;  incision and drainage with percutaneous pinning left index finger      Family History  Problem Relation Age of Onset   Diabetes Mother    Hypertension Mother    Heart attack Mother      Social History   Socioeconomic History   Marital status: Divorced    Spouse name: Not on file   Number of children: Not on file   Years of education: Not on file   Highest education level: Not on file  Occupational History   Not on file   Tobacco Use   Smoking status: Every Day    Packs/day: 0.50    Types: Cigarettes   Smokeless tobacco: Current    Types: Snuff  Vaping Use   Vaping Use: Never used  Substance and Sexual Activity   Alcohol use: No   Drug use: No   Sexual activity: Not on file  Other Topics Concern   Not on file  Social History Narrative   Not on file   Social Determinants of Health   Financial Resource Strain: Not on file  Food Insecurity: Not on file  Transportation Needs: Not on file  Physical Activity: Not on file  Stress: Not on file  Social Connections: Not on file  Intimate Partner Violence: Not on file     BP 110/68   Pulse (!) 103   Ht '5\' 11"'$  (1.803 m)   Wt 140 lb (63.5 kg)   SpO2 98%   BMI 19.53 kg/m   Physical Exam:  Well appearing NAD HEENT: Unremarkable Neck:  No JVD, no thyromegally Lymphatics:  No adenopathy Back:  No CVA tenderness Lungs:  Clear with no wheezes HEART:  Regular rate rhythm, no murmurs, no rubs, no clicks Abd:  soft, positive bowel sounds, no organomegally, no rebound, no guarding Ext:  2 plus pulses, no edema, no cyanosis, no clubbing Skin:  No rashes no nodules Neuro:  CN II through XII intact, motor grossly intact  EKG - atrial fib with a CVR/RVR   Assess/Plan:  Persistent atrial fib - he will continue low dose flecainide. I have asked him to start toprol 25 mg daily. Additional adjustments to his meds when he returns for followup. His CHADSVASC is one. We will follow.   Carleene Overlie Gavan Nordby,MD

## 2021-06-28 NOTE — Patient Instructions (Signed)
Medication Instructions:   Start Toprol XL 25 mg Daily   *If you need a refill on your cardiac medications before your next appointment, please call your pharmacy*   Lab Work: NONE   If you have labs (blood work) drawn today and your tests are completely normal, you will receive your results only by: [place] (if you have MyChart) OR A paper copy in the mail If you have any lab test that is abnormal or we need to change your treatment, we will call you to review the results.   Testing/Procedures: NONE    Follow-Up: At [facility], you and your health needs are our priority.  As part of our continuing mission to provide you with exceptional heart care, we have created designated Provider Care Teams.  These Care Teams include your primary Cardiologist (physician) and Advanced Practice Providers (APPs -  Physician Assistants and Nurse Practitioners) who all work together to provide you with the care you need, when you need it.  We recommend signing up for the patient portal called "MyChart".  Sign up information is provided on this After Visit Summary.  MyChart is used to connect with patients for Virtual Visits (Telemedicine).  Patients are able to view lab/test results, encounter notes, upcoming appointments, etc.  Non-urgent messages can be sent to your provider as well.   To learn more about what you can do with MyChart, go to [email or web address].    Your next appointment:   6 month(s)  The format for your next appointment:   In Person  Provider:   Cristopher Peru, MD    Other Instructions Thank you for choosing [facility]!    Important Information About Sugar

## 2021-08-11 ENCOUNTER — Encounter (HOSPITAL_COMMUNITY)
Admission: RE | Admit: 2021-08-11 | Discharge: 2021-08-11 | Disposition: A | Discharge status: Home / Self Care | Payer: No Typology Code available for payment source | Source: Ambulatory Visit | Attending: Gastroenterology | Admitting: Gastroenterology

## 2021-08-15 ENCOUNTER — Ambulatory Visit (HOSPITAL_BASED_OUTPATIENT_CLINIC_OR_DEPARTMENT_OTHER): Payer: Medicare Other | Admitting: Anesthesiology

## 2021-08-15 ENCOUNTER — Ambulatory Visit (HOSPITAL_COMMUNITY)
Admission: RE | Admit: 2021-08-15 | Discharge: 2021-08-15 | Disposition: A | Discharge status: Home / Self Care | Payer: Medicare Other | Attending: Gastroenterology | Admitting: Gastroenterology

## 2021-08-15 ENCOUNTER — Encounter (HOSPITAL_COMMUNITY): Payer: Self-pay | Admitting: Gastroenterology

## 2021-08-15 ENCOUNTER — Ambulatory Visit (HOSPITAL_COMMUNITY): Payer: Medicare Other | Admitting: Anesthesiology

## 2021-08-15 ENCOUNTER — Encounter (HOSPITAL_COMMUNITY)
Admission: RE | Disposition: A | Discharge status: Home / Self Care | Payer: Self-pay | Source: Home / Self Care | Attending: Gastroenterology

## 2021-08-15 DIAGNOSIS — Z1211 Encounter for screening for malignant neoplasm of colon: Secondary | ICD-10-CM

## 2021-08-15 DIAGNOSIS — D123 Benign neoplasm of transverse colon: Secondary | ICD-10-CM | POA: Insufficient documentation

## 2021-08-15 DIAGNOSIS — K5730 Diverticulosis of large intestine without perforation or abscess without bleeding: Secondary | ICD-10-CM | POA: Diagnosis not present

## 2021-08-15 DIAGNOSIS — K319 Disease of stomach and duodenum, unspecified: Secondary | ICD-10-CM | POA: Diagnosis not present

## 2021-08-15 DIAGNOSIS — D124 Benign neoplasm of descending colon: Secondary | ICD-10-CM | POA: Insufficient documentation

## 2021-08-15 DIAGNOSIS — K6289 Other specified diseases of anus and rectum: Secondary | ICD-10-CM | POA: Insufficient documentation

## 2021-08-15 DIAGNOSIS — Z79899 Other long term (current) drug therapy: Secondary | ICD-10-CM | POA: Insufficient documentation

## 2021-08-15 DIAGNOSIS — F17210 Nicotine dependence, cigarettes, uncomplicated: Secondary | ICD-10-CM | POA: Diagnosis not present

## 2021-08-15 DIAGNOSIS — K219 Gastro-esophageal reflux disease without esophagitis: Secondary | ICD-10-CM | POA: Insufficient documentation

## 2021-08-15 DIAGNOSIS — I10 Essential (primary) hypertension: Secondary | ICD-10-CM | POA: Insufficient documentation

## 2021-08-15 DIAGNOSIS — K2980 Duodenitis without bleeding: Secondary | ICD-10-CM

## 2021-08-15 DIAGNOSIS — K222 Esophageal obstruction: Secondary | ICD-10-CM | POA: Diagnosis not present

## 2021-08-15 DIAGNOSIS — K3189 Other diseases of stomach and duodenum: Secondary | ICD-10-CM | POA: Diagnosis not present

## 2021-08-15 DIAGNOSIS — R1310 Dysphagia, unspecified: Secondary | ICD-10-CM

## 2021-08-15 DIAGNOSIS — K635 Polyp of colon: Secondary | ICD-10-CM

## 2021-08-15 DIAGNOSIS — D122 Benign neoplasm of ascending colon: Secondary | ICD-10-CM | POA: Insufficient documentation

## 2021-08-15 DIAGNOSIS — K209 Esophagitis, unspecified without bleeding: Secondary | ICD-10-CM

## 2021-08-15 DIAGNOSIS — D125 Benign neoplasm of sigmoid colon: Secondary | ICD-10-CM | POA: Diagnosis not present

## 2021-08-15 DIAGNOSIS — K644 Residual hemorrhoidal skin tags: Secondary | ICD-10-CM

## 2021-08-15 DIAGNOSIS — I4891 Unspecified atrial fibrillation: Secondary | ICD-10-CM | POA: Insufficient documentation

## 2021-08-15 HISTORY — PX: POLYPECTOMY: SHX5525

## 2021-08-15 HISTORY — PX: BIOPSY: SHX5522

## 2021-08-15 HISTORY — PX: COLONOSCOPY WITH PROPOFOL: SHX5780

## 2021-08-15 HISTORY — PX: ESOPHAGOGASTRODUODENOSCOPY (EGD) WITH PROPOFOL: SHX5813

## 2021-08-15 LAB — HM COLONOSCOPY

## 2021-08-15 SURGERY — COLONOSCOPY WITH PROPOFOL
Anesthesia: General

## 2021-08-15 MED ORDER — LIDOCAINE HCL (CARDIAC) PF 50 MG/5ML IV SOSY
PREFILLED_SYRINGE | INTRAVENOUS | Status: DC | PRN
  Administered 2021-08-15: 100 mg via INTRAVENOUS

## 2021-08-15 MED ORDER — PROPOFOL 500 MG/50ML IV EMUL
INTRAVENOUS | Status: DC | PRN
  Administered 2021-08-15: 125 ug/kg/min via INTRAVENOUS
  Administered 2021-08-15: 115 ug/kg/min via INTRAVENOUS

## 2021-08-15 MED ORDER — PHENYLEPHRINE HCL (PRESSORS) 10 MG/ML IV SOLN
INTRAVENOUS | Status: DC | PRN
  Administered 2021-08-15: 160 ug via INTRAVENOUS
  Administered 2021-08-15: 80 ug via INTRAVENOUS
  Administered 2021-08-15: 160 ug via INTRAVENOUS

## 2021-08-15 MED ORDER — PROPOFOL 10 MG/ML IV BOLUS
INTRAVENOUS | Status: DC | PRN
  Administered 2021-08-15 (×2): 40 mg via INTRAVENOUS
  Administered 2021-08-15 (×2): 20 mg via INTRAVENOUS
  Administered 2021-08-15: 10 mg via INTRAVENOUS
  Administered 2021-08-15: 20 mg via INTRAVENOUS
  Administered 2021-08-15: 60 mg via INTRAVENOUS

## 2021-08-15 MED ORDER — LACTATED RINGERS IV SOLN
INTRAVENOUS | Status: DC
  Administered 2021-08-15: 1000 mL via INTRAVENOUS

## 2021-08-15 NOTE — Anesthesia Preprocedure Evaluation (Addendum)
Anesthesia Evaluation  Patient identified by MRN, date of birth, ID band Patient awake    Reviewed: Allergy & Precautions, H&P , NPO status , Patient's Chart, lab work & pertinent test results, reviewed documented beta blocker date and time   Airway Mallampati: II  TM Distance: >3 FB Neck ROM: full    Dental no notable dental hx. (+) Edentulous Upper, Edentulous Lower   Pulmonary neg pulmonary ROS, Current Smoker and Patient abstained from smoking.,    Pulmonary exam normal breath sounds clear to auscultation       Cardiovascular Exercise Tolerance: Good hypertension, + dysrhythmias Atrial Fibrillation  Rhythm:regular Rate:Normal     Neuro/Psych negative neurological ROS  negative psych ROS   GI/Hepatic Neg liver ROS, GERD  Medicated,  Endo/Other  negative endocrine ROS  Renal/GU negative Renal ROS  negative genitourinary   Musculoskeletal negative musculoskeletal ROS (+)   Abdominal   Peds  Hematology negative hematology ROS (+)   Anesthesia Other Findings   Reproductive/Obstetrics negative OB ROS                            Anesthesia Physical  Anesthesia Plan  ASA: 3  Anesthesia Plan: General   Post-op Pain Management:    Induction:   PONV Risk Score and Plan: 1 and TIVA  Airway Management Planned: Nasal Cannula  Additional Equipment:   Intra-op Plan:   Post-operative Plan:   Informed Consent: I have reviewed the patients History and Physical, chart, labs and discussed the procedure including the risks, benefits and alternatives for the proposed anesthesia with the patient or authorized representative who has indicated his/her understanding and acceptance.       Plan Discussed with: CRNA  Anesthesia Plan Comments:         Anesthesia Quick Evaluation

## 2021-08-15 NOTE — Anesthesia Postprocedure Evaluation (Signed)
Anesthesia Post Note  Patient: Kent Davidson  Procedure(s) Performed: COLONOSCOPY WITH PROPOFOL ESOPHAGOGASTRODUODENOSCOPY (EGD) WITH PROPOFOL BIOPSY POLYPECTOMY  Patient location during evaluation: Phase II Anesthesia Type: General Level of consciousness: awake and alert Pain management: pain level controlled Vital Signs Assessment: post-procedure vital signs reviewed and stable Respiratory status: spontaneous breathing, nonlabored ventilation, respiratory function stable and patient connected to nasal cannula oxygen Cardiovascular status: blood pressure returned to baseline and stable Postop Assessment: no apparent nausea or vomiting Anesthetic complications: no   There were no known notable events for this encounter.   Last Vitals:  Vitals:   08/15/21 1015 08/15/21 1023  BP: 97/71   Pulse: 81   Resp: 20   Temp:  36.6 C  SpO2: 96%     Last Pain:  Vitals:   08/15/21 1023  TempSrc: Oral  PainSc:                  Trixie Rude

## 2021-08-15 NOTE — Op Note (Signed)
[facility] Patient Name: Kent Davidson Procedure Date: 08/15/2021 9:03 AM MRN: [id number] Date of Birth: 11/16/1955 Attending MD: Maylon Peppers ,  CSN: [id number] Age: 66 Admit Type: Outpatient Procedure:                Colonoscopy Indications:              Screening for colorectal malignant neoplasm Providers:                Maylon Peppers, Janeece Riggers, RN, Raphael Gibney,                            Technician Referring MD:              Medicines:                Monitored Anesthesia Care Complications:            No immediate complications. Estimated Blood Loss:     Estimated blood loss: none. Procedure:                Pre-Anesthesia Assessment:                           - Prior to the procedure, a History and Physical                            was performed, and patient medications, allergies                            and sensitivities were reviewed. The patient's                            tolerance of previous anesthesia was reviewed.                           - The risks and benefits of the procedure and the                            sedation options and risks were discussed with the                            patient. All questions were answered and informed                            consent was obtained.                           - ASA Grade Assessment: III - A patient with severe                            systemic disease.                           After obtaining informed consent, the colonoscope                            was passed under direct vision. Throughout the  procedure, the patient's blood pressure, pulse, and                            oxygen saturations were monitored continuously. The                            PCF-HQ190L ([id number]) scope was introduced through                            the anus and advanced to the the cecum, identified                            by appendiceal orifice and ileocecal valve. The                             colonoscopy was performed without difficulty. The                            patient tolerated the procedure well. The quality                            of the bowel preparation was adequate to identify                            polyps 6 mm and larger in size. Scope In: 9:35:00 AM Scope Out: 10:05:41 AM Scope Withdrawal Time: 0 hours 18 minutes 18 seconds  Total Procedure Duration: 0 hours 30 minutes 41 seconds  Findings:      Hemorrhoids were found on perianal exam.      Five sessile polyps were found in the sigmoid colon, descending colon,       transverse colon and ascending colon. The polyps were 3 to 10 mm in       size. These polyps were removed with a cold snare. Resection and       retrieval were complete.      A few small-mouthed diverticula were found in the sigmoid colon.      Anal papilla(e) were hypertrophied.      Non-bleeding external hemorrhoids were found during perianal exam. The       hemorrhoids were medium-sized. Impression:               - Hemorrhoids found on perianal exam.                           - Five 3 to 10 mm polyps in the sigmoid colon, in                            the descending colon, in the transverse colon and                            in the ascending colon, removed with a cold snare.                            Resected and retrieved.                           -  Diverticulosis in the sigmoid colon.                           - Anal papilla(e) were hypertrophied.                           - Non-bleeding external hemorrhoids. Moderate Sedation:      Per Anesthesia Care Recommendation:           - Discharge patient to home (ambulatory).                           - Resume previous diet.                           - Await pathology results.                           - Repeat colonoscopy in 3 years for surveillance. Procedure Code(s):        --- Professional ---                           [phone], Colonoscopy, flexible; with  removal of                            tumor(s), polyp(s), or other lesion(s) by snare                            technique Diagnosis Code(s):        --- Professional ---                           Z12.11, Encounter for screening for malignant                            neoplasm of colon                           K64.4, Residual hemorrhoidal skin tags                           K63.5, Polyp of colon                           K62.89, Other specified diseases of anus and rectum                           K57.30, Diverticulosis of large intestine without                            perforation or abscess without bleeding CPT copyright 2019 American Medical Association. All rights reserved. The codes documented in this report are preliminary and upon coder review may  be revised to meet current compliance requirements. Maylon Peppers, MD Maylon Peppers,  08/15/2021 10:12:18 AM This report has been signed electronically. Number of Addenda: 0

## 2021-08-15 NOTE — H&P (Signed)
Kent Davidson is an 66 y.o. male.   Chief Complaint: follow up food impaction/esophageal stricture and CRC screening HPI: 66 year old male with past medical history of A-fib, recurrent food impaction due to esophageal stricture, GERD, coming for follow-up of food impaction/esophageal stricture and CRC screening.  Patient reports that since his last EGD he has felt well and denies any episodes of dysphagia.  No nausea, vomiting, fever, chills, abdominal pain or distention, no melena or hematochezia.  No family history of colon cancer.  States that his last colonoscopy was performed at least 25 years ago.  Past Medical History:  Diagnosis Date   Atrial fibrillation [facility])    Documented October 2020   Esophageal obstruction due to food impaction    October 2020   Gastroesophageal reflux     Past Surgical History:  Procedure Laterality Date   APPENDECTOMY     ESOPHAGEAL DILATION  11/19/2018   Procedure: ESOPHAGEAL DILATION;  Surgeon: Rogene Houston, MD;  [facility];  Service: Endoscopy;;   ESOPHAGEAL DILATION  05/24/2021   Procedure: ESOPHAGEAL DILATION;  Surgeon: Rogene Houston, MD;  [facility];  Service: Endoscopy;;   ESOPHAGOGASTRODUODENOSCOPY N/A 11/19/2018   Procedure: ESOPHAGOGASTRODUODENOSCOPY (EGD);  Surgeon: Rogene Houston, MD;  [facility];  Service: Endoscopy;  Laterality: N/A;   ESOPHAGOGASTRODUODENOSCOPY (EGD) WITH PROPOFOL N/A 05/24/2021   Procedure: ESOPHAGOGASTRODUODENOSCOPY (EGD) WITH PROPOFOL;  Surgeon: Rogene Houston, MD;  [facility];  Service: Endoscopy;  Laterality: N/A;   FOREIGN BODY REMOVAL N/A 05/24/2021   Procedure: FOREIGN BODY REMOVAL;  Surgeon: Rogene Houston, MD;  [facility];  Service: Endoscopy;  Laterality: N/A;   I & D EXTREMITY Right 10/03/2018   Procedure: IRRIGATION AND DEBRIDEMENT  RIGHT ULNA;  Surgeon: Carole Civil, MD;  [facility];  Service: Orthopedics;   Laterality: Right;   ORIF ULNAR FRACTURE Right 10/03/2018   Procedure: OPEN REDUCTION INTERNAL FIXATION (ORIF) RIGHT ULNAR FRACTURE;  Surgeon: Carole Civil, MD;  [facility];  Service: Orthopedics;  Laterality: Right;   TENDON REPAIR  11/13/2011   Procedure: TENDON REPAIR;  Surgeon: Tennis Must, MD;  Location: [place];  Service: Orthopedics;  Laterality: Left;  incision and drainage with percutaneous pinning left index finger     Family History  Problem Relation Age of Onset   Diabetes Mother    Hypertension Mother    Heart attack Mother    Social History:  reports that he has been smoking cigarettes. He has been smoking an average of .5 packs per day. His smokeless tobacco use includes snuff. He reports that he does not drink alcohol and does not use drugs.  Allergies:  Allergies  Allergen Reactions   Bee Venom Anaphylaxis    Medications Prior to Admission  Medication Sig Dispense Refill   aspirin EC 81 MG tablet Take 1 tablet (81 mg total) by mouth daily with breakfast. 90 tablet 3   atorvastatin (LIPITOR) 20 MG tablet Take 1 tablet (20 mg total) by mouth daily. 90 tablet 3   diltiazem (CARDIZEM) 30 MG tablet May take as needed  30 mg for rapid heart rate over 100. May repeat once after 30 minutes. Take no more than 4 tablets in 24 hours 60 tablet 3   flecainide (TAMBOCOR) 50 MG tablet Take 1 tablet (50 mg total) by mouth 2 (two) times daily. 180 tablet 3   levothyroxine (SYNTHROID) 75 MCG tablet Take 75 mcg  by mouth daily before breakfast.     metoprolol succinate (TOPROL XL) 25 MG 24 hr tablet Take 1 tablet (25 mg total) by mouth daily. 90 tablet 3   pantoprazole (PROTONIX) 40 MG tablet Take 1 tablet (40 mg total) by mouth 2 (two) times daily. 180 tablet 3   polyethylene glycol-electrolytes (TRILYTE) 420 g solution Take 4,000 mLs by mouth as directed. 4000 mL 0   sertraline (ZOLOFT) 50 MG tablet Take 50 mg by mouth daily.     Vitamin D,  Ergocalciferol, (DRISDOL) 1.25 MG (50000 UNIT) CAPS capsule Take 50,000 Units by mouth once a week.      No results found for this or any previous visit (from the past 48 hour(s)). No results found.  Review of Systems  All other systems reviewed and are negative.   Blood pressure (!) 124/91, temperature 97.8 F (36.6 C), temperature source Oral, resp. rate 17, SpO2 100 %. Physical Exam  GENERAL: The patient is AO x3, in no acute distress. HEENT: Head is normocephalic and atraumatic. EOMI are intact. Mouth is well hydrated and without lesions. NECK: Supple. No masses LUNGS: Clear to auscultation. No presence of rhonchi/wheezing/rales. Adequate chest expansion HEART: RRR, normal s1 and s2. ABDOMEN: Soft, nontender, no guarding, no peritoneal signs, and nondistended. BS +. No masses. EXTREMITIES: Without any cyanosis, clubbing, rash, lesions or edema. NEUROLOGIC: AOx3, no focal motor deficit. SKIN: no jaundice, no rashes  Assessment/Plan 66 year old male with past medical history of A-fib, recurrent food impaction due to esophageal stricture, GERD, coming for follow-up of food impaction/esophageal stricture and CRC screening.  We will proceed with EGD and colonoscopy.  Harvel Quale, MD 08/15/2021, 8:19 AM

## 2021-08-15 NOTE — Transfer of Care (Addendum)
Immediate Anesthesia Transfer of Care Note  Patient: Kent Davidson  Procedure(s) Performed: COLONOSCOPY WITH PROPOFOL ESOPHAGOGASTRODUODENOSCOPY (EGD) WITH PROPOFOL BIOPSY POLYPECTOMY  Patient Location: Short Stay  Anesthesia Type:General  Level of Consciousness: sedated and patient cooperative  Airway & Oxygen Therapy: Patient Spontanous Breathing and Patient connected to nasal cannula oxygen  Post-op Assessment: Report given to RN and Post -op Vital signs reviewed and stable  Post vital signs: Reviewed and stable  Last Vitals:  Vitals Value Taken Time  BP 97/71 08/15/21 1015  Temp 36.6 1023  Pulse 81 08/15/21 1015  Resp 20 08/15/21 1015  SpO2 96 % 08/15/21 1015    Last Pain:  Vitals:   08/15/21 1015  TempSrc:   PainSc: 0-No pain      Patients Stated Pain Goal: 8 (91/50/56 [id number])  Complications: No notable events documented.

## 2021-08-15 NOTE — Discharge Instructions (Addendum)
You are being discharged to home.  Resume your previous diet.  We are waiting for your pathology results.  Do not take any ibuprofen (including Advil, Motrin or Nuprin), naproxen, or other non-steroidal anti-inflammatory drugs.  Your physician has recommended a repeat colonoscopy in three years for surveillance.

## 2021-08-15 NOTE — Op Note (Signed)
[facility] Patient Name: Kent Davidson Procedure Date: 08/15/2021 9:04 AM MRN: [id number] Date of Birth: Jul 05, 1955 Attending MD: Maylon Peppers ,  CSN: [id number] Age: 66 Admit Type: Outpatient Procedure:                Upper GI endoscopy Indications:              Dysphagia, Follow-up of esophageal stenosis Providers:                Maylon Peppers, Janeece Riggers, RN, Raphael Gibney,                            Technician Referring MD:              Medicines:                Monitored Anesthesia Care Complications:            No immediate complications. Estimated Blood Loss:     Estimated blood loss: none. Procedure:                Pre-Anesthesia Assessment:                           - Prior to the procedure, a History and Physical                            was performed, and patient medications, allergies                            and sensitivities were reviewed. The patient's                            tolerance of previous anesthesia was reviewed.                           - The risks and benefits of the procedure and the                            sedation options and risks were discussed with the                            patient. All questions were answered and informed                            consent was obtained.                           - ASA Grade Assessment: III - A patient with severe                            systemic disease.                           After obtaining informed consent, the endoscope was                            passed under direct vision. Throughout the  procedure, the patient's blood pressure, pulse, and                            oxygen saturations were monitored continuously. The                            GIF-H190 ([id number]) scope was introduced through the                            mouth, and advanced to the second part of duodenum.                            The upper GI endoscopy was accomplished  without                            difficulty. The patient tolerated the procedure                            well. Scope In: 9:15:19 AM Scope Out: 9:25:58 AM Total Procedure Duration: 0 hours 10 minutes 39 seconds  Findings:      One benign-appearing, intrinsic mild stenosis was found at the       gastroesophageal junction. This stenosis measured 1.6 cm (inner       diameter) x less than one cm (in length). The stenosis was traversed. A       TTS dilator was passed through the scope. Dilation with an 18-19-20 mm       balloon dilator was performed to 20 mm.      Multiple localized small erosions with no stigmata of recent bleeding       were found in the gastric body. Biopsies were taken with a cold forceps       for Helicobacter pylori testing.      Segmental mild inflammation characterized by congestion (edema) and       erythema was found in the entire duodenum. Impression:               - Benign-appearing esophageal stenosis. Dilated.                           - Erosive gastropathy with no stigmata of recent                            bleeding. Biopsied.                           - Duodenitis. Moderate Sedation:      Per Anesthesia Care Recommendation:           - Discharge patient to home (ambulatory).                           - Resume previous diet.                           - Await pathology results.                           - Continue pantoprazole  40 mg twice a day.                           - Check H. pylori serology                           - No ibuprofen, naproxen, or other non-steroidal                            anti-inflammatory drugs. Procedure Code(s):        --- Professional ---                           [phone], Esophagogastroduodenoscopy, flexible,                            transoral; with transendoscopic balloon dilation of                            esophagus (less than 30 mm diameter)                           43239, 59, Esophagogastroduodenoscopy, flexible,                             transoral; with biopsy, single or multiple Diagnosis Code(s):        --- Professional ---                           K22.2, Esophageal obstruction                           K31.89, Other diseases of stomach and duodenum                           K29.80, Duodenitis without bleeding                           R13.10, Dysphagia, unspecified CPT copyright 2019 American Medical Association. All rights reserved. The codes documented in this report are preliminary and upon coder review may  be revised to meet current compliance requirements. Maylon Peppers, MD Maylon Peppers,  08/15/2021 10:08:09 AM This report has been signed electronically. Number of Addenda: 0

## 2021-08-16 ENCOUNTER — Encounter (INDEPENDENT_AMBULATORY_CARE_PROVIDER_SITE_OTHER): Payer: Self-pay | Admitting: *Deleted

## 2021-08-16 LAB — SURGICAL PATHOLOGY

## 2021-08-17 ENCOUNTER — Encounter (INDEPENDENT_AMBULATORY_CARE_PROVIDER_SITE_OTHER): Payer: Self-pay | Admitting: Gastroenterology

## 2021-08-17 ENCOUNTER — Ambulatory Visit (INDEPENDENT_AMBULATORY_CARE_PROVIDER_SITE_OTHER): Payer: Medicare Other | Admitting: Gastroenterology

## 2021-08-17 VITALS — BP 114/80 | HR 59 | Temp 98.9°F | Ht 71.0 in | Wt 138.3 lb

## 2021-08-17 DIAGNOSIS — K642 Third degree hemorrhoids: Secondary | ICD-10-CM | POA: Diagnosis not present

## 2021-08-17 NOTE — Progress Notes (Signed)
    [place] BANDING PROCEDURE NOTE  Kent Davidson is a 65 y.o. male presenting today for consideration of hemorrhoid banding. Last colonoscopy 08/15/21  Patient reports having BM 2-3x/day, stools somewhat harder, spends about 10 minutes on the toilet during each BM. Patient reports hemmorhoidal tissue protrudes after a BM with the need to manually reduce. He has no rectal bleeding, itching, pain but does endorse leaking and rectal pressure.   The patient presents with symptomatic grade 3 hemorrhoids, unresponsive to maximal medical therapy, requesting rubber band ligation of his/her hemorrhoidal disease. All risks, benefits, and alternative forms of therapy were described and informed consent was obtained.  The decision was made to band the Right Posterior internal hemorrhoid, and the [name] was used to perform band ligation without complication. Digital anorectal examination was then performed to assure proper positioning of the band, and to adjust the banded tissue as required. The patient was discharged home without pain or other issues. Dietary and behavioral recommendations were given, along with follow-up instructions. The patient will return in several weeks for followup and possible additional banding as required.  No complications were encountered and the patient tolerated the procedure well.   Makaia Rappa L. Alver Sorrow, MSN, APRN, AGNP-C Adult-Gerontology Nurse Practitioner [facility]

## 2021-08-17 NOTE — Patient Instructions (Addendum)
Please continue to avoid straining.  You should limit your toilet time to 5 minutes at the most.   Continue to avoid constipation, Please add 1T fiber supplement to meals/drinks 3 times per day and increase your water intake.  You may feel pressure or the urge to defecate, please let me know if you develop any pain in your rectal area.  Please call me with any concerns or issues!  I will see you in follow-up for additional banding in several weeks.

## 2021-08-21 ENCOUNTER — Encounter (HOSPITAL_COMMUNITY): Payer: Self-pay | Admitting: Gastroenterology

## 2021-08-25 DIAGNOSIS — E039 Hypothyroidism, unspecified: Secondary | ICD-10-CM | POA: Diagnosis not present

## 2021-08-25 DIAGNOSIS — E559 Vitamin D deficiency, unspecified: Secondary | ICD-10-CM | POA: Diagnosis not present

## 2021-08-25 DIAGNOSIS — E782 Mixed hyperlipidemia: Secondary | ICD-10-CM | POA: Diagnosis not present

## 2021-08-25 DIAGNOSIS — R7301 Impaired fasting glucose: Secondary | ICD-10-CM | POA: Diagnosis not present

## 2021-08-30 DIAGNOSIS — K648 Other hemorrhoids: Secondary | ICD-10-CM | POA: Diagnosis not present

## 2021-08-30 DIAGNOSIS — E782 Mixed hyperlipidemia: Secondary | ICD-10-CM | POA: Diagnosis not present

## 2021-08-30 DIAGNOSIS — R001 Bradycardia, unspecified: Secondary | ICD-10-CM | POA: Diagnosis not present

## 2021-08-30 DIAGNOSIS — Z6820 Body mass index (BMI) 20.0-20.9, adult: Secondary | ICD-10-CM | POA: Diagnosis not present

## 2021-08-30 DIAGNOSIS — E559 Vitamin D deficiency, unspecified: Secondary | ICD-10-CM | POA: Diagnosis not present

## 2021-08-30 DIAGNOSIS — H9011 Conductive hearing loss, unilateral, right ear, with unrestricted hearing on the contralateral side: Secondary | ICD-10-CM | POA: Diagnosis not present

## 2021-08-30 DIAGNOSIS — R079 Chest pain, unspecified: Secondary | ICD-10-CM | POA: Diagnosis not present

## 2021-08-30 DIAGNOSIS — E039 Hypothyroidism, unspecified: Secondary | ICD-10-CM | POA: Diagnosis not present

## 2021-08-30 DIAGNOSIS — N3281 Overactive bladder: Secondary | ICD-10-CM | POA: Diagnosis not present

## 2021-08-30 DIAGNOSIS — H6121 Impacted cerumen, right ear: Secondary | ICD-10-CM | POA: Diagnosis not present

## 2021-09-12 ENCOUNTER — Encounter (INDEPENDENT_AMBULATORY_CARE_PROVIDER_SITE_OTHER): Payer: Self-pay | Admitting: Gastroenterology

## 2021-09-12 ENCOUNTER — Ambulatory Visit (INDEPENDENT_AMBULATORY_CARE_PROVIDER_SITE_OTHER): Payer: Medicare Other | Admitting: Gastroenterology

## 2021-09-12 VITALS — BP 122/73 | HR 59 | Temp 97.9°F | Ht 71.0 in | Wt 144.0 lb

## 2021-09-12 DIAGNOSIS — K642 Third degree hemorrhoids: Secondary | ICD-10-CM | POA: Diagnosis not present

## 2021-09-12 NOTE — Progress Notes (Signed)
   [place] BANDING PROCEDURE NOTE  Kent Davidson is a 66 y.o. male presenting today for consideration of hemorrhoid banding. Last colonoscopy 08/15/21  The patient presents with symptomatic grade 3 hemorrhoids, unresponsive to maximal medical therapy, requesting rubber band ligation of his/her hemorrhoidal disease.  Patient feeling better with improvement in symptoms from initial banding on 08/17/21 and no longer having symptoms. This will be his second banding.   All risks, benefits, and alternative forms of therapy were described and informed consent was obtained.  The decision was made to band the right anterior internal hemorrhoid, and the [name] was used to perform band ligation without complication. Digital anorectal examination was then performed to assure proper positioning of the band, and to adjust the banded tissue as required. The patient was discharged home without pain or other issues. Dietary and behavioral recommendations were given, along with follow-up instructions. The patient will return in several weeks for followup and possible additional banding as required.  No complications were encountered and the patient tolerated the procedure well.    [name] L. Alver Sorrow, MSN, APRN, AGNP-C Adult-Gerontology Nurse Practitioner [facility]

## 2021-09-12 NOTE — Patient Instructions (Addendum)
Please continue to avoid straining.  You should limit your toilet time to 2-3 minutes at the most.   Continue to avoid constipation, make sure you are drinking at least 64 oz of water per day and continue with 1T benefiber three times daily with meals.   You may feel pressure or the urge to defecate, please let me know if you develop any pain in your rectal area.   Please call me with any concerns or issues!  I will see you in follow-up for additional banding in several weeks.

## 2021-09-22 ENCOUNTER — Telehealth (INDEPENDENT_AMBULATORY_CARE_PROVIDER_SITE_OTHER): Payer: Self-pay

## 2021-09-22 NOTE — Telephone Encounter (Signed)
Patient made aware that we will see him on Monday 09/25/2021. He says he does limited toilet time to less than five minutes. He says he does not want anusol sent in he will wait to see what needs to be done on Monday 09/25/2021 appointment. Mitzie please cancel the Wednesday 09/27/2021 appointment please

## 2021-09-22 NOTE — Telephone Encounter (Signed)
Patient states he has "tissue" protruding from rectum during bm's. He noticed this yesterday during a bowel movement. He says he is in no pain and has no bleeding,but he is having to push the tissues back up into the rectum. He says it is more irritating than any thing. Please advise.

## 2021-09-25 ENCOUNTER — Ambulatory Visit (INDEPENDENT_AMBULATORY_CARE_PROVIDER_SITE_OTHER): Payer: Medicare Other | Admitting: Gastroenterology

## 2021-09-27 ENCOUNTER — Ambulatory Visit (INDEPENDENT_AMBULATORY_CARE_PROVIDER_SITE_OTHER): Payer: Medicare Other | Admitting: Gastroenterology

## 2021-09-27 ENCOUNTER — Encounter (INDEPENDENT_AMBULATORY_CARE_PROVIDER_SITE_OTHER): Payer: Self-pay | Admitting: Gastroenterology

## 2021-09-27 VITALS — BP 126/78 | HR 50 | Temp 97.8°F | Ht 71.0 in | Wt 137.1 lb

## 2021-09-27 DIAGNOSIS — K642 Third degree hemorrhoids: Secondary | ICD-10-CM

## 2021-09-27 NOTE — Progress Notes (Signed)
    [place] BANDING PROCEDURE NOTE  Kent Davidson is a 66 y.o. male presenting today for consideration of hemorrhoid banding. Last colonoscopy 08/15/21  The patient presents today for third session of hemorrhoid banding with symptomatic grade 3 hemorrhoids, unresponsive to maximal medical therapy, requesting rubber band ligation of his/her hemorrhoidal disease. Initially presenting with hemorrhoidal protrusion after BMs with need to manually reduce. Had improvement of symptoms after initial banding, however, notes some rectal discomfort on Sunday after a BM with protrusion of tissue that he had to manually reduce, denies bleeding.    Initial banding of Right posterior on 08/17/21 with improvement in symptoms. Second banding was 08/17/21 with banding to Right anterior hemorrhoid.   All risks, benefits, and alternative forms of therapy were described and informed consent was obtained.  The decision was made to band the Left lateral internal hemorrhoid, and the [place] was used to perform band ligation without complication. Digital anorectal examination was then performed to assure proper positioning of the band, and to adjust the banded tissue as required. The patient was discharged home without pain or other issues. Dietary and behavioral recommendations were given, along with follow-up instructions. The patient will return in several weeks for followup and possible additional banding as required.  No complications were encountered and the patient tolerated the procedure well.   Lavonne Cass L. Alver Sorrow, MSN, APRN, AGNP-C Adult-Gerontology Nurse Practitioner [facility]

## 2021-09-27 NOTE — Patient Instructions (Addendum)
Continue to avoid straining.   Limit toilet time to <5 minutes at the most.   Avoid constipation.straining. Take 1T benefiber with each meal and increase your water intake to 7-8 glasses daily.  Occasionally, you may have more bleeding than usual after the banding procedure. This is often from the untreated hemorrhoids rather than the treated one. Don't be concerned if there is a tablespoon or so of blood. If there is more blood than this, lie flat with your bottom higher than your head and apply an ice pack to the area. If the bleeding does not stop within a half an hour or if you feel faint, have severe pain, chills, fever or difficulty passing urine (very rare) or other problems, you should call us at [phone] or report to the nearest emergency room. Please call me with any concerns!  The procedure you have had should have been relatively painless since the banding of the area involved does not have nerve endings and there is no pain sensation. The rubber band cuts off the blood supply to the hemorrhoid and the band may fall off as soon as 48 hours after the banding (the band may occasionally be seen in the toilet bowl following a bowel movement). You may notice a temporary feeling of fullness in the rectum which should respond adequately to plain Tylenol or Motrin.  I will see you back in follow-up in the next 4-6 weeks

## 2021-11-18 DIAGNOSIS — Z743 Need for continuous supervision: Secondary | ICD-10-CM | POA: Diagnosis not present

## 2021-11-18 DIAGNOSIS — R404 Transient alteration of awareness: Secondary | ICD-10-CM | POA: Diagnosis not present

## 2021-12-07 ENCOUNTER — Encounter (INDEPENDENT_AMBULATORY_CARE_PROVIDER_SITE_OTHER): Payer: Medicare Other | Admitting: Gastroenterology

## 2022-01-02 ENCOUNTER — Ambulatory Visit: Payer: Medicare Other | Admitting: Internal Medicine

## 2022-01-03 ENCOUNTER — Encounter: Payer: Self-pay | Admitting: Internal Medicine

## 2022-01-12 IMAGING — CT CT HEAD W/O CM
3 series · 16 of 47 positions shown, 19 images · non-contrast
Comparison: August 29, 2001

CLINICAL DATA: Ataxia. Stroke suspected.

EXAM:
CT HEAD WITHOUT CONTRAST
TECHNIQUE: Contiguous axial images were obtained from the base of the skull
through the vertex without intravenous contrast.

[Series 2: head w o · axial · 0.48mm/px · z∈[+1421,+1546]mm · 10 of 31 slices shown, 13 images]
[im 3/31  brain]
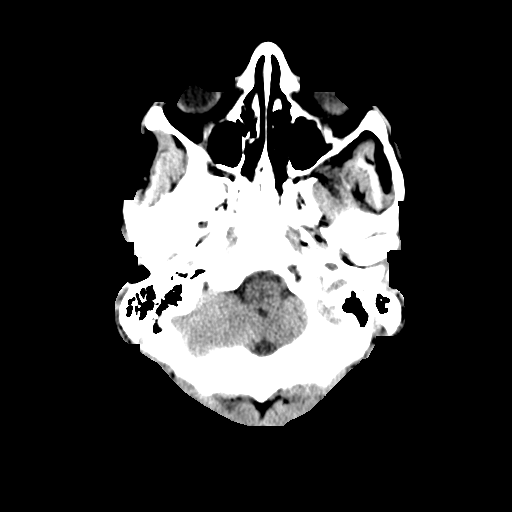
[im 3/31  bone]
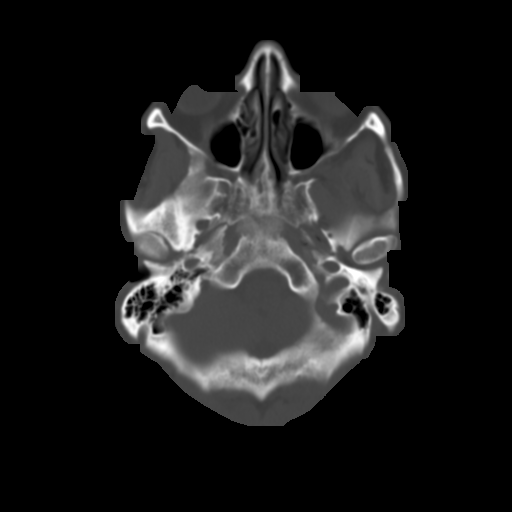
[im 6/31  brain]
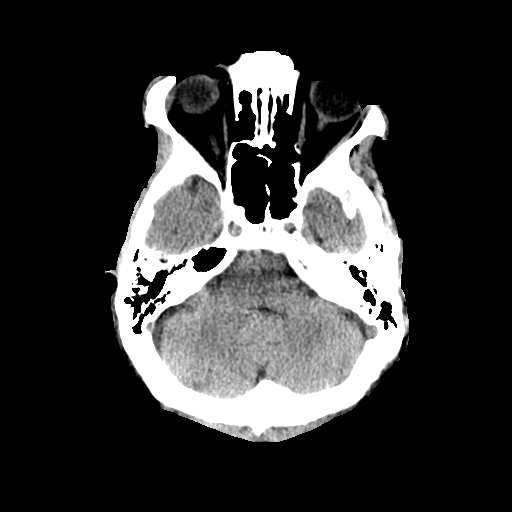
[im 9/31  brain]
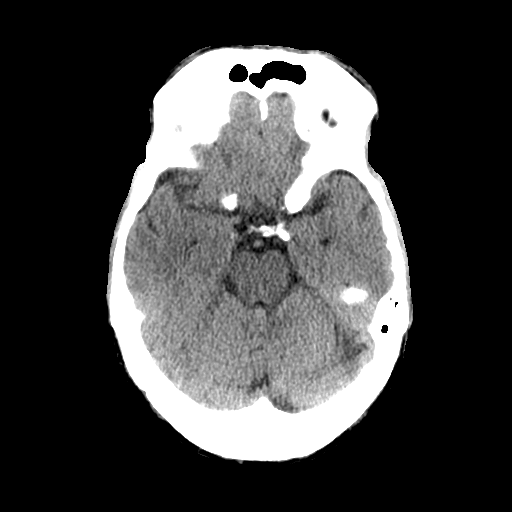
[im 11/31  brain]
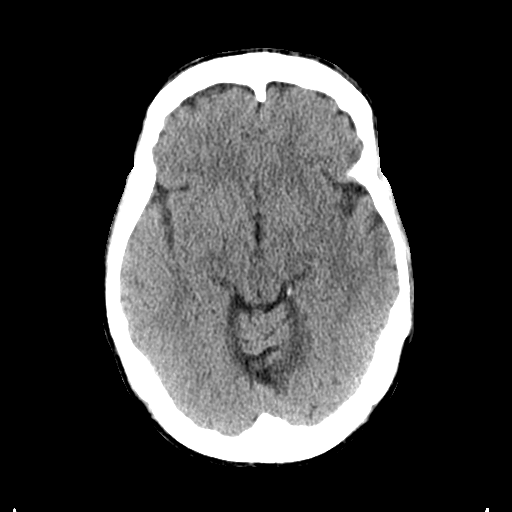
[im 14/31  brain]
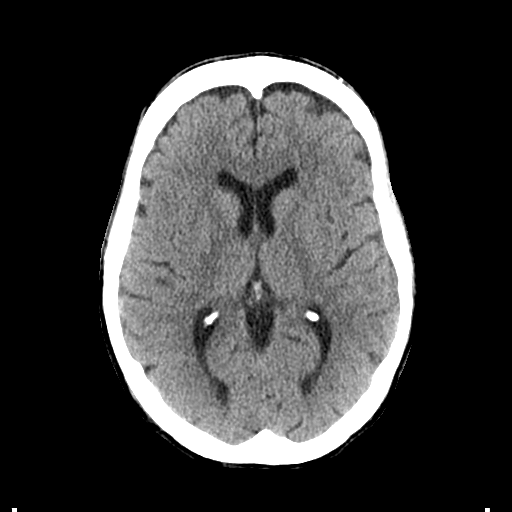
[im 14/31  bone]
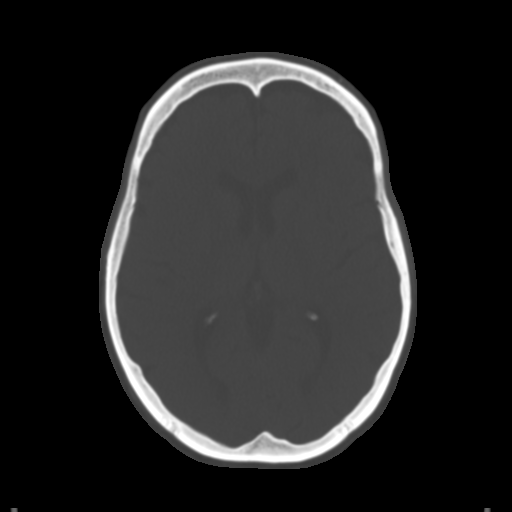
[im 17/31  brain]
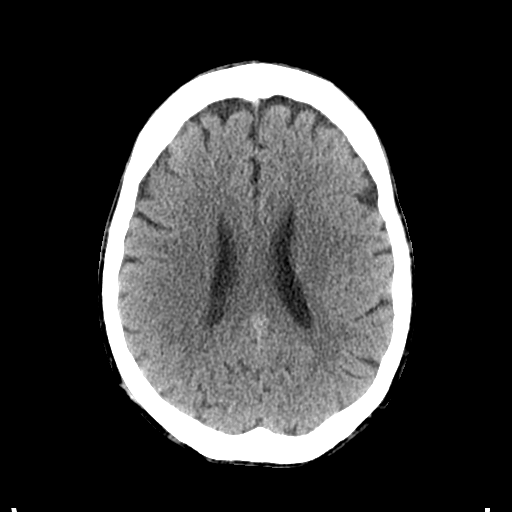
[im 20/31  brain]
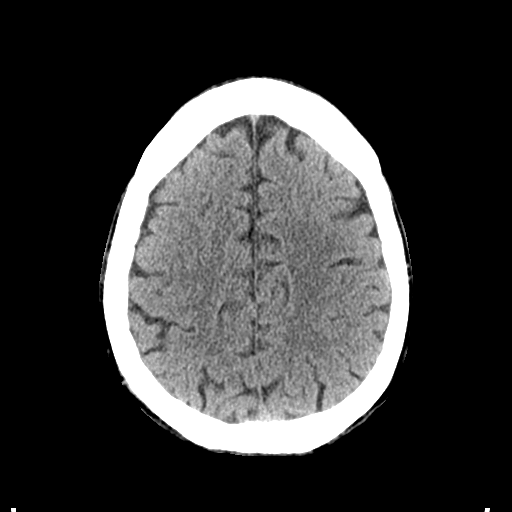
[im 23/31  brain]
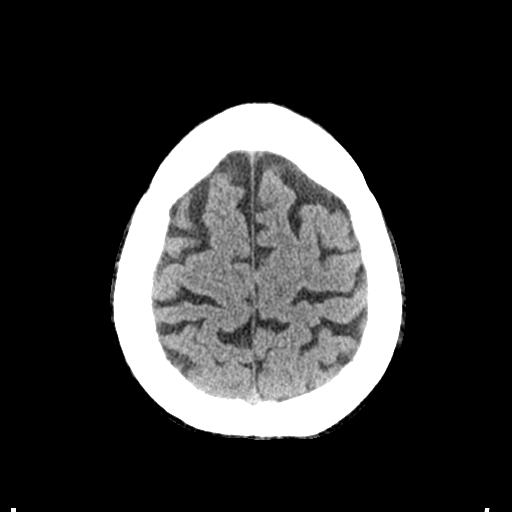
[im 25/31  brain]
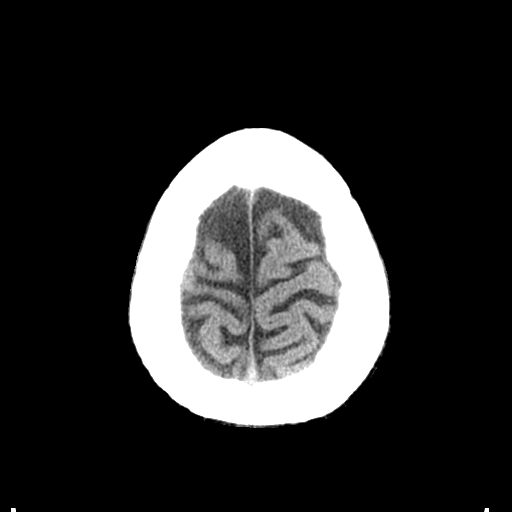
[im 25/31  bone]
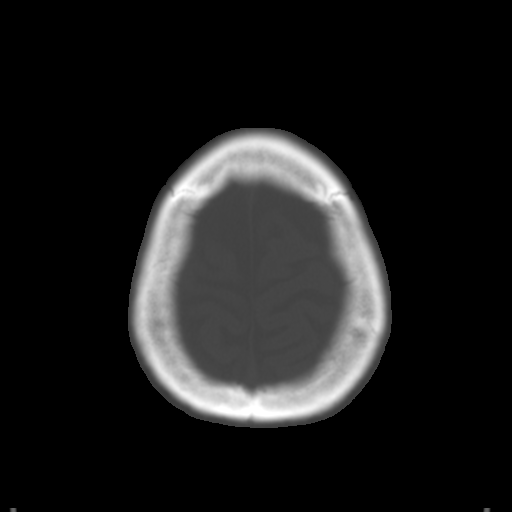
[im 28/31  brain]
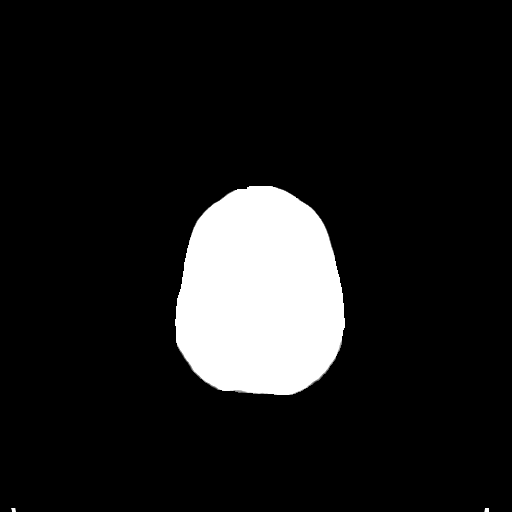

[Series 4: coronal soft · coronal · 0.33mm/px · 3 of 82 slices shown]
[im 28/82  brain]
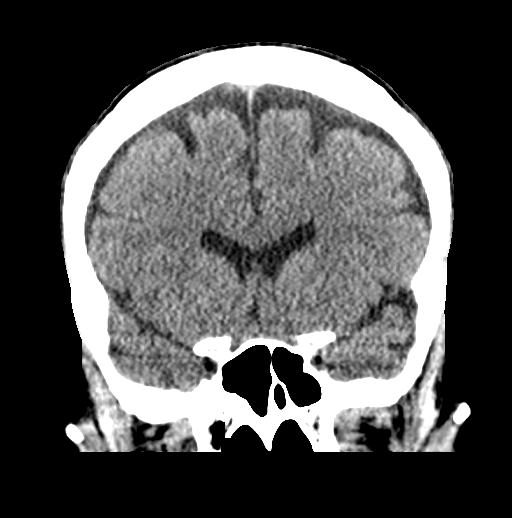
[im 37/82  brain]
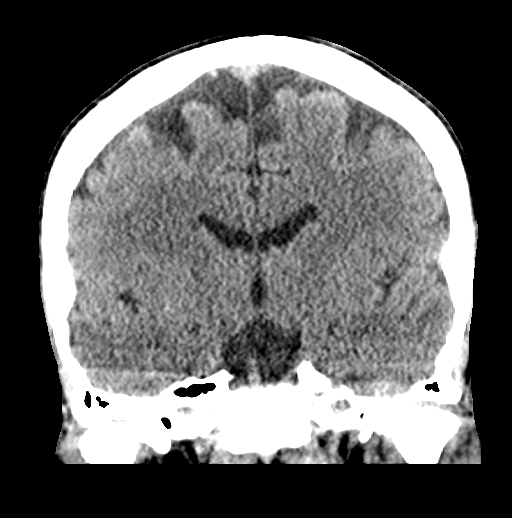
[im 46/82  brain]
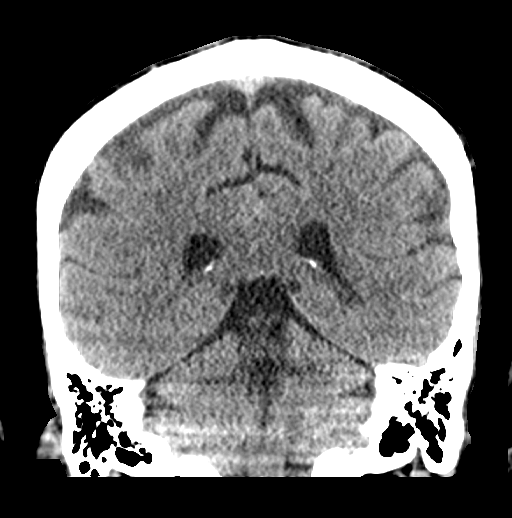

[Series 5: sagittal soft · sagittal · 0.33mm/px · 3 of 66 slices shown]
[im 22/66  brain]
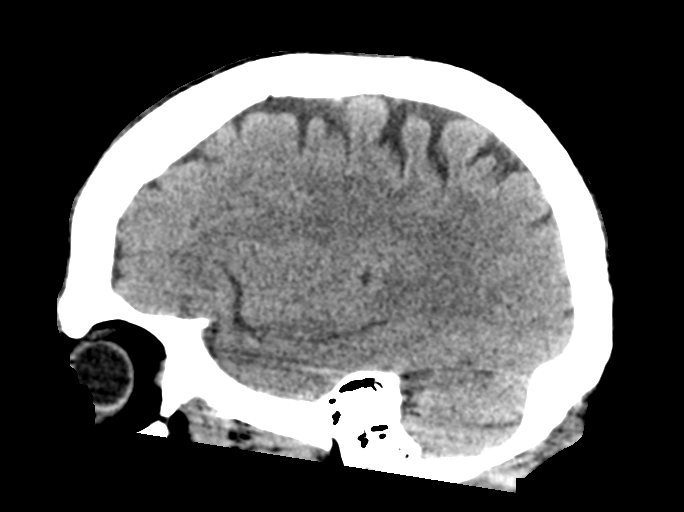
[im 33/66  brain]
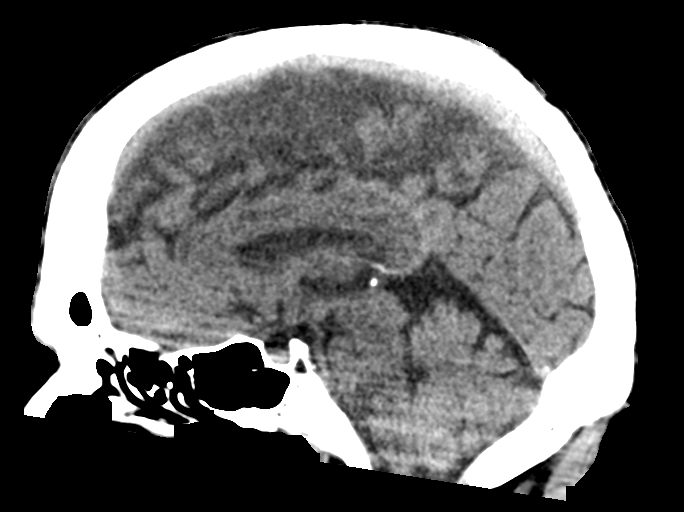
[im 44/66  brain]
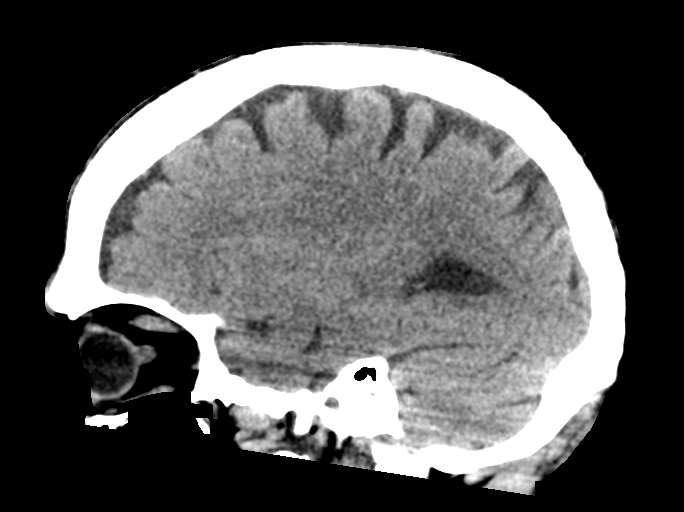

[16 of 47 positions shown; findings below may reference images not displayed]

FINDINGS: Brain: No evidence of acute infarction, hemorrhage, hydrocephalus,
extra-axial collection or mass lesion/mass effect.

Vascular: No hyperdense vessel or unexpected calcification.

Skull: Normal. Negative for fracture or focal lesion.

Sinuses/Orbits: No acute finding.

Other: None.
IMPRESSION: No acute intracranial abnormality.
